# Patient Record
Sex: Female | Born: 1950 | Race: White | Hispanic: No | Marital: Married | Smoking: Former smoker
Health system: Southern US, Community
[De-identification: ages and names within clinical notes are randomized; demographics above are authoritative.]

## PROBLEM LIST (undated history)

## (undated) DIAGNOSIS — E079 Disorder of thyroid, unspecified: Secondary | ICD-10-CM

## (undated) DIAGNOSIS — Z973 Presence of spectacles and contact lenses: Secondary | ICD-10-CM

## (undated) DIAGNOSIS — K219 Gastro-esophageal reflux disease without esophagitis: Secondary | ICD-10-CM

## (undated) DIAGNOSIS — G4711 Idiopathic hypersomnia with long sleep time: Secondary | ICD-10-CM

## (undated) DIAGNOSIS — R112 Nausea with vomiting, unspecified: Secondary | ICD-10-CM

## (undated) DIAGNOSIS — G4730 Sleep apnea, unspecified: Secondary | ICD-10-CM

## (undated) DIAGNOSIS — Z9889 Other specified postprocedural states: Secondary | ICD-10-CM

## (undated) DIAGNOSIS — C50919 Malignant neoplasm of unspecified site of unspecified female breast: Secondary | ICD-10-CM

## (undated) DIAGNOSIS — E039 Hypothyroidism, unspecified: Secondary | ICD-10-CM

## (undated) HISTORY — PX: COSMETIC SURGERY: SHX468

## (undated) HISTORY — DX: Malignant neoplasm of unspecified site of unspecified female breast: C50.919

## (undated) HISTORY — PX: TOTAL THYROIDECTOMY: SHX2547

## (undated) HISTORY — PX: TONSILLECTOMY: SUR1361

## (undated) HISTORY — PX: COLONOSCOPY: SHX174

## (undated) HISTORY — PX: ABDOMINAL HYSTERECTOMY: SHX81

## (undated) HISTORY — PX: FACIAL COSMETIC SURGERY: SHX629

---

## 1968-04-07 HISTORY — PX: CLAVICLE SURGERY: SHX598

## 1968-04-07 HISTORY — PX: ORIF WRIST FRACTURE: SHX2133

## 1968-04-07 HISTORY — PX: PLEURAL SCARIFICATION: SHX748

## 1970-04-07 HISTORY — PX: THYROIDECTOMY, PARTIAL: SHX18

## 1991-04-08 HISTORY — PX: THYROIDECTOMY: SHX17

## 1999-04-08 HISTORY — PX: CARPAL TUNNEL RELEASE: SHX101

## 1999-12-12 ENCOUNTER — Other Ambulatory Visit: Admission: RE | Admit: 1999-12-12 | Discharge: 1999-12-12 | Payer: Self-pay | Admitting: Obstetrics and Gynecology

## 2000-03-05 ENCOUNTER — Other Ambulatory Visit: Admission: RE | Admit: 2000-03-05 | Discharge: 2000-03-05 | Payer: Self-pay | Admitting: Obstetrics and Gynecology

## 2000-10-13 ENCOUNTER — Other Ambulatory Visit: Admission: RE | Admit: 2000-10-13 | Discharge: 2000-10-13 | Payer: Self-pay | Admitting: Obstetrics and Gynecology

## 2001-02-22 ENCOUNTER — Other Ambulatory Visit: Admission: RE | Admit: 2001-02-22 | Discharge: 2001-02-22 | Payer: Self-pay | Admitting: Obstetrics and Gynecology

## 2002-01-18 ENCOUNTER — Ambulatory Visit (HOSPITAL_BASED_OUTPATIENT_CLINIC_OR_DEPARTMENT_OTHER): Admission: RE | Admit: 2002-01-18 | Discharge: 2002-01-18 | Payer: Self-pay | Admitting: Orthopedic Surgery

## 2002-02-28 ENCOUNTER — Emergency Department (HOSPITAL_COMMUNITY): Admission: EM | Admit: 2002-02-28 | Discharge: 2002-03-01 | Payer: Self-pay | Admitting: Emergency Medicine

## 2002-03-08 ENCOUNTER — Other Ambulatory Visit: Admission: RE | Admit: 2002-03-08 | Discharge: 2002-03-08 | Payer: Self-pay | Admitting: *Deleted

## 2002-12-29 ENCOUNTER — Encounter: Payer: Self-pay | Admitting: Orthopedic Surgery

## 2002-12-29 ENCOUNTER — Ambulatory Visit (HOSPITAL_COMMUNITY): Admission: RE | Admit: 2002-12-29 | Discharge: 2002-12-29 | Payer: Self-pay | Admitting: Orthopedic Surgery

## 2003-02-10 ENCOUNTER — Observation Stay (HOSPITAL_COMMUNITY): Admission: RE | Admit: 2003-02-10 | Discharge: 2003-02-11 | Payer: Self-pay | Admitting: Neurosurgery

## 2003-03-20 ENCOUNTER — Other Ambulatory Visit: Admission: RE | Admit: 2003-03-20 | Discharge: 2003-03-20 | Payer: Self-pay | Admitting: Obstetrics and Gynecology

## 2008-09-12 ENCOUNTER — Emergency Department (HOSPITAL_COMMUNITY): Admission: EM | Admit: 2008-09-12 | Discharge: 2008-09-12 | Payer: Self-pay | Admitting: Emergency Medicine

## 2010-08-23 NOTE — Op Note (Signed)
NAME:  Charlotte [name]                           ACCOUNT NO.:  [id number]   MEDICAL RECORD NO.:  [id number]                   PATIENT TYPE:  INP   LOCATION:  3032                                 FACILITY:  [facility]   PHYSICIAN:  Danae Orleans. Venetia Maxon, M.D.               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   PROCEDURE:  Anterior cervical decompression and fusion C4-5 and C5-6 levels  with allograft bone graft and anterior cervical plate.   SURGEON:  Danae Orleans. Venetia Maxon, M.D.   ASSISTANT:  Hewitt Shorts, M.D.   ANESTHESIA:  General endotracheal anesthesia.   ESTIMATED BLOOD LOSS:  Minimal.   COMPLICATIONS:  None, disposition to recovery.   INDICATIONS FOR PROCEDURE:  Charlotte [name] is a 60 year old woman with a  herniated cervical disk  and cervical spondylosis at the C4-5 and C5-6  levels. She has significant bilateral upper extremity pain and weakness. It  was elected to take her to surgery for anterior cervical decompression and  fusion of the affected level.   DESCRIPTION OF PROCEDURE:  Charlotte [name] is brought to the operating room.  Following  the satisfactory and uncomplicated induction of general  endotracheal anesthesia she placed in the supine position on the operating  table. Her neck  was placed in extension and placed in 10 pounds of halter  traction. Her anterior neck  was then prepped and draped in the usual  sterile fashion. The area of planned incision was infiltrated with 0.25%  Marcaine and 0.5% Lidocaine with 1:200,000 epinephrine.   An incision was made in the midline, followed from the  midline  to the  anterior border of the sternocleidomastoid muscle on the left side of the  midline. This was carried sharply through the platysmal layers. Subplatysmal  dissection was performed exposing the anterior border of the  sternocleidomastoid muscle. Using blunt dissection the carotid sheath was  kept lateral  and the trachea and esophagus kept medial and the anterior  cervical spine was identified.   A bent spinal needle was placed in what was found to be C3-4 level. Exposure  was then performed at the C4-5 and C5-6 levels and the longus coli muscles  were taken down from the anterior cervical spine using electrocautery and  key elevator from C4 through C6 levels bilaterally. A self-retaining  Shadowline retractor was placed to facilitate exposure along __________  retractor.   The C4-5 and C5-6 levels were then incised with a #15 blade  and disk  material was removed in a piecemeal fashion. A variety of Carlen curets  were  used to remove the endplates of residual cartilaginous and disk material. A  disk  space spreader was placed at each level  sequentially, and using the  operating microscope the uncinate spurs were drilled down  initially at the  C5-6 level, and subsequently the posterior longitudinal ligament was incised  with an arachnoid knife and removed in a piecemeal fashion resulting in  decompression of the central spinal cord dura and both C6 nerve roots as  they extended up the neuroforamina. Hemostasis was assured with Gelfoam  soaked in thrombin. After using trial sizers, a 7-mm cortico cancellous  allograft bone wedge was inserted into the interspace and countersunk  appropriately.   Attention was then turned to the C4-5 level where a s similar decompression  was performed. There were large uncinate spurs and these were drilled down  and subsequently removed with a 2-mm gold tipped Kerrison rongeur. The  interspace was decompressed as were both C5 nerve roots  as they exited out  the neuroforamina. A similarly sized bone graft was rehydrated, inserted  into the interspace and countersunk appropriately.   Subsequently a 40-mm Trinica anterior cervical plate was affixed to the  anterior cervical spine using 14-mm variable angle screws, four, 2 at C5 and  2 at C6 levels. The locking mechanisms were engaged. Final x-ray confirmed  positioning of bone grafts and anterior cervical plate. The wound was then  copiously irrigated with Bacitracin saline. The soft tissues were inspected  and found to be in good repair.   The platysma layer was then closed with 3-0 Vicryl suture. The subcutaneous  tissues were reapproximated with running 4-0 Vicryl subcuticular stitch. The  wound was dressed with Dermabond.   The patient was extubated in the operating room and taken to the recovery  room in stable satisfactory condition having tolerated  the operation well.  Counts were correct at the end of the case.                                               Danae Orleans. Venetia Maxon, M.D.    JDS/MEDQ  D:  02/10/2003  T:  02/11/2003  Job:  [id number]

## 2010-08-23 NOTE — Op Note (Signed)
NAME:  Charlotte [name], Charlotte [name]                           ACCOUNT NO.:  [id number]   MEDICAL RECORD NO.:  [id number]                   PATIENT TYPE:  AMB   LOCATION:  DSC                                  FACILITY:  [facility]   PHYSICIAN:  Katy Fitch. Naaman Plummer., M.D.          DATE OF BIRTH:  24-Sep-1950   DATE OF PROCEDURE:  01/18/2002  DATE OF DISCHARGE:                                 OPERATIVE REPORT   PREOPERATIVE DIAGNOSIS:  Entrapment neuropathy, median nerve, right carpal  tunnel.   POSTOPERATIVE DIAGNOSIS:  Entrapment neuropathy, median nerve, right carpal  tunnel.   PROCEDURE:  Release of right transverse carpal ligament.   SURGEON:  Katy Fitch. Sypher, M.D.   ASSISTANT:  Jonni Sanger, P.A.   ANESTHESIA:  General by LMA supervised by the anesthesiologist, Judie Petit, M.D.   INDICATIONS:  The patient is a 60 year old woman who presented for  evaluation and management of hand discomfort and numbness.  Clinical  examination suggested carpal tunnel syndrome.  Electrodiagnostic studies  confirmed median neuropathy at the level of the right transverse carpal  ligament.   Due to a failure to respond to nonoperative measures, she is brought to the  operating room at this time for release of her right transverse carpal  ligament.   DESCRIPTION OF PROCEDURE:  The patient was brought to the operating room and  placed in the supine position on the operating table.  Following induction  of general anesthesia by LMA, the right arm was prepped with Betadine soap  and solution and sterilely draped.   Following exsanguination of the limb with Esmarch bandage, the arterial  tourniquet was inflated to 220 mmHg.  The procedure commenced with a short  incision in the line of the ring finger in the palm.  Subcutaneous tissues  were carefully divided to reveal the palmar fascia.  This was split  longitudinally to reveal the common sensory branch of the median nerve.  These were followed  back to the transverse carpal ligament, which was  carefully isolated from the median nerve.   Bleeding points along the margin of the ligament were electrocauterized with  bipolar current.   The wound was then repaired with intradermal 3-0 Prolene.  A compressive  dressing was applied with a volar plaster splint maintaining the wrist in 5  degrees of dorsiflexion.   The patient tolerated the surgery and anesthesia well.  She was transferred  to recovery with stable vital signs.   She will be discharged in the care of her family with prescriptions for  Darvocet-N 100 one or two tablets p.o. q.4-6h. p.r.n. pain, 20 tablets  without refill.  She is also advised to use Tylenol or Advil obtained over-  the-counter p.r.n. mild pain.  Katy Fitch Naaman Plummer., M.D.    RVS/MEDQ  D:  01/18/2002  T:  01/19/2002  Job:  [id number]   cc:   Geoffry Paradise, MD  [address]  [place]  [place] [address]  Fax: [phone]

## 2012-06-18 ENCOUNTER — Other Ambulatory Visit: Payer: Self-pay | Admitting: Dermatology

## 2012-12-27 ENCOUNTER — Emergency Department (HOSPITAL_COMMUNITY)
Admission: EM | Admit: 2012-12-27 | Discharge: 2012-12-28 | Disposition: A | Discharge status: Home / Self Care | Payer: BC Managed Care – PPO | Attending: Emergency Medicine | Admitting: Emergency Medicine

## 2012-12-27 ENCOUNTER — Emergency Department (HOSPITAL_COMMUNITY): Payer: BC Managed Care – PPO

## 2012-12-27 ENCOUNTER — Encounter (HOSPITAL_COMMUNITY): Payer: Self-pay | Admitting: Emergency Medicine

## 2012-12-27 DIAGNOSIS — Z862 Personal history of diseases of the blood and blood-forming organs and certain disorders involving the immune mechanism: Secondary | ICD-10-CM | POA: Insufficient documentation

## 2012-12-27 DIAGNOSIS — H538 Other visual disturbances: Secondary | ICD-10-CM | POA: Insufficient documentation

## 2012-12-27 DIAGNOSIS — R51 Headache: Secondary | ICD-10-CM | POA: Insufficient documentation

## 2012-12-27 DIAGNOSIS — H532 Diplopia: Secondary | ICD-10-CM | POA: Insufficient documentation

## 2012-12-27 DIAGNOSIS — Z8639 Personal history of other endocrine, nutritional and metabolic disease: Secondary | ICD-10-CM | POA: Insufficient documentation

## 2012-12-27 HISTORY — DX: Disorder of thyroid, unspecified: E07.9

## 2012-12-27 LAB — COMPREHENSIVE METABOLIC PANEL
ALT: 25 U/L (ref 0–35)
AST: 32 U/L (ref 0–37)
Albumin: 4.3 g/dL (ref 3.5–5.2)
Alkaline Phosphatase: 71 U/L (ref 39–117)
Calcium: 10 mg/dL (ref 8.4–10.5)
Chloride: 105 mEq/L (ref 96–112)
GFR calc Af Amer: >90 mL/min (ref >90)
Glucose, Bld: 89 mg/dL (ref 70–99)
Potassium: 3.8 mEq/L (ref 3.5–5.1)
Sodium: 140 mEq/L (ref 135–145)
Total Bilirubin: 0.5 mg/dL (ref 0.3–1.2)
Total Protein: 7.2 g/dL (ref 6.0–8.3)

## 2012-12-27 LAB — CBC
Hemoglobin: 14.7 g/dL (ref 12.0–15.0)
MCH: 29.9 pg (ref 26.0–34.0)
Platelets: 220 10*3/uL (ref 150–400)
RBC: 4.91 MIL/uL (ref 3.87–5.11)
RDW: 13 % (ref 11.5–15.5)
WBC: 5.7 10*3/uL (ref 4.0–10.5)

## 2012-12-27 LAB — DIFFERENTIAL
Basophils Absolute: 0.1 K/uL (ref 0.0–0.1)
Basophils Relative: 1 % (ref 0–1)
Eosinophils Absolute: 0.1 K/uL (ref 0.0–0.7)
Eosinophils Relative: 2 % (ref 0–5)
Lymphocytes Relative: 35 % (ref 12–46)
Lymphs Abs: 2 K/uL (ref 0.7–4.0)
Monocytes Absolute: 0.6 K/uL (ref 0.1–1.0)
Monocytes Relative: 10 % (ref 3–12)
Neutro Abs: 2.9 K/uL (ref 1.7–7.7)
Neutrophils Relative %: 51 % (ref 43–77)

## 2012-12-27 LAB — SEDIMENTATION RATE: Sed Rate: 5 mm/h (ref 0–22)

## 2012-12-27 LAB — APTT: aPTT: 28 s (ref 24–37)

## 2012-12-27 LAB — PROTIME-INR
INR: 0.98 (ref 0.00–1.49)
Prothrombin Time: 12.8 seconds (ref 11.6–15.2)

## 2012-12-27 LAB — POCT I-STAT TROPONIN I: Troponin i, poc: 0 ng/mL (ref 0.00–0.08)

## 2012-12-27 NOTE — ED Provider Notes (Signed)
CSN: [id number]     Arrival date & time 12/27/12  1446 History   First MD Initiated Contact with Patient 12/27/12 1609     Chief Complaint  Patient presents with  . Diplopia   (Consider location/radiation/quality/duration/timing/severity/associated sxs/prior Treatment) HPI Comments: 62 year old female presents from her eye doctor's office with acute blurry vision. States it started about 7:30 AM. She awoke normally and then later had acute onset of trouble seeing. She noticed an hour later that it was more double vision. She does wish to her upper left eye all her symptoms seemed to resolve. She has not had symptoms for almost 9 hours. Patient feels a global sensation in her head but is not sure if his pain or just being uncomfortable. She feels like maybe a little bit of pressure behind her eyes bilaterally. She went to an eye doctor (Dr. Velna Ochs) who states that her eyes were dilated by the time he examined her but he noticed some left eso and hypotropia that were not present on exam a few weeks ago. She was sent here to rule out stroke. Patient does state that her symptoms seem to be better when looking straight ahead and exacerbated by turning side to side.  The history is provided by the patient.    Past Medical History  Diagnosis Date  . Thyroid disease    History reviewed. No pertinent past surgical history. History reviewed. No pertinent family history. History  Substance Use Topics  . Smoking status: Never Smoker   . Smokeless tobacco: Not on file  . Alcohol Use: Yes   OB History   Grav Para Term Preterm Abortions TAB SAB Ect Mult Living                 Review of Systems  Constitutional: Negative for fever.  Eyes: Positive for photophobia and visual disturbance. Negative for pain and redness.  Gastrointestinal: Negative for vomiting.  Neurological: Negative for speech difficulty, weakness, numbness and headaches.  All other systems reviewed and are  negative.    Allergies  Review of patient's allergies indicates no known allergies.  Home Medications  No current outpatient prescriptions on file. BP 166/67  Pulse 65  Temp(Src) 98 F (36.7 C) (Oral)  Resp 18  SpO2 99% Physical Exam  Nursing note and vitals reviewed. Constitutional: She is oriented to person, place, and time. She appears well-developed and well-nourished.  HENT:  Head: Normocephalic and atraumatic.  Right Ear: External ear normal.  Left Ear: External ear normal.  Nose: Nose normal.  Eyes: EOM are normal. Right eye exhibits no discharge. Left eye exhibits no discharge.  Eyes are dilated  Neck: Normal range of motion. Neck supple.  Cardiovascular: Normal rate, regular rhythm and normal heart sounds.   Pulmonary/Chest: Effort normal and breath sounds normal.  Abdominal: Soft. There is no tenderness.  Neurological: She is alert and oriented to person, place, and time. She has normal strength. No cranial nerve deficit or sensory deficit. GCS eye subscore is 4. GCS verbal subscore is 5. GCS motor subscore is 6.  Skin: Skin is warm and dry.    ED Course  Procedures (including critical care time) Labs Review Labs Reviewed  COMPREHENSIVE METABOLIC PANEL - Abnormal; Notable for the following:    GFR calc non Af Amer 89 (*)    All other components within normal limits  PROTIME-INR  APTT  CBC  DIFFERENTIAL  TROPONIN I  SEDIMENTATION RATE  C-REACTIVE PROTEIN  POCT I-STAT TROPONIN I  Imaging Review Ct Head (brain) Wo Contrast  12/27/2012   CLINICAL DATA:  62 year old female with blurred vision at 0700 hr progressing to double vision. Headache.  EXAM: CT HEAD WITHOUT CONTRAST  TECHNIQUE: Contiguous axial images were obtained from the base of the skull through the vertex without intravenous contrast.  COMPARISON:  None.  FINDINGS: Cervical ACDF evident on the scout view. Visualized paranasal sinuses and mastoids are clear. No acute osseous abnormality identified.  Visualized orbits and scalp soft tissues are within normal limits.  Normal cerebral volume. No midline shift, ventriculomegaly, mass effect, evidence of mass lesion, intracranial hemorrhage or evidence of cortically based acute infarction. Gray-white matter differentiation is within normal limits throughout the brain. No suspicious intracranial vascular hyperdensity.  IMPRESSION: Normal noncontrast CT appearance of the brain.   Electronically Signed   By: Augusto Gamble M.D.   On: 12/27/2012 15:46   Mr Maxine Glenn Head Wo Contrast  12/27/2012   CLINICAL DATA:  Left diploplia.  Evaluate for aneurysm  EXAM: MRA HEAD WITHOUT CONTRAST  TECHNIQUE: MRA HEAD WITHOUT CONTRAST  COMPARISON:  None.  FINDINGS: Standard intracranial arterial anatomy. No aneurysm. No major vessel occlusion or significant stenosis.  IMPRESSION: Negative for cerebral aneurysm.   Electronically Signed   By: Tiburcio Pea   On: 12/27/2012 23:46   Mr Brain Wo Contrast  12/27/2012   *RADIOLOGY REPORT*  Clinical Data: Diplopia.  Blurred vision.  MRI HEAD WITHOUT CONTRAST  Technique:  Multiplanar, multiecho pulse sequences of the brain and surrounding structures were obtained according to standard protocol without intravenous contrast.  Comparison: CT head 12/27/2012.  Findings: There is no evidence for acute infarction, intracranial hemorrhage, mass lesion, hydrocephalus, or extra-axial fluid. Normal cerebral volume.  Minimal periventricular signal abnormality, nonspecific, could represent early chronic microvascular ischemic change.  No midline shift.  Flow voids are maintained.  No osseous lesions.  Visualized orbits appear  symmetric and normal.  No sinus or mastoid disease.  7 mm T2 bright lesion deep lobe of the left parotid, likely incidental cyst or benign neoplasm.  Recommend 52-month MRI neck without and with contrast for follow up.  Other extracranial soft tissues unremarkable. No visible thyroid related extraocular muscle enlargement.  Compared  with prior CT, there is good general agreement.  IMPRESSION: No acute intracranial findings. No visible orbital abnormality. Mild nonspecific periventricular white matter signal abnormality.  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  6 months follow-up recommended.   Original Report Authenticated By: Davonna Belling, M.D.    MDM   1. Diplopia    MRI, MRA, CT head and labs are all negative. Neuro evaluated in ED, feel she needs further ophtho w/u. Discussed with Lelan Pons [facility] ophthamology covering for neuro ophtho) who agrees with workup and states there is nothing else to acutely be done. Recommends f/u with her eye doctor tomorrow for further testing and precautions against driving, etc. Also d/w Dr. Delaney Meigs (ophthamology) who states patient can f/u with Dr. Lucretia Roers or himself, but also agrees the workup in the ED is sufficient and that she needs strict return precautions but otherwise can follow up in the AM. I discussed this with patient and husband, and they understand and had their questions answered. As there is no headache with normal ESR I feel temporal arteritis is unlikely, as well as any type of SAH, mass or other cause of diplopia. She seems to have normal EOM and no lid lag to suggest a frank nerve palsy.    Shaheem Pichon  Scarlette Calico, MD 12/28/12 [phone]

## 2012-12-27 NOTE — ED Notes (Signed)
Pt sts woke up at 0700 and then started having blurry vision that turned to double vision at 0830; pt sts mild HA; pt saw eye doctor today and sent here; pt denies other complaint; spoke with EDP RL about pt

## 2012-12-27 NOTE — ED Notes (Signed)
Patient transported to MRI 

## 2012-12-27 NOTE — ED Notes (Signed)
Received report from off going RN.  Pt is MRI

## 2012-12-27 NOTE — Consult Note (Signed)
NEURO HOSPITALIST CONSULT NOTE    Reason for Consult: Diplopia  HPI:                                                                                                                                          Charlotte [name] is an 62 y.o. female who woke up this AM feeling normal. One hour after waking she noted she had blurred vision. 2 hours after waking she was driving her car and noted horizontal diplopia.  She called her primary care MD and could not get a appointment but did have a appointment with her ophthalmologist. The ophthalmological exam was negative for intrinsic eye etiology and she was told to go to ED.  Currently she is having far gaze diplopia with no other neurological symptoms.   Past Medical History  Diagnosis Date  . Thyroid disease     History reviewed. No pertinent past surgical history.  Family History  Problem Relation Age of Onset  . Hypertension Mother   . Hypertension Father     Social History:  reports that she has never smoked. She does not have any smokeless tobacco history on file. She reports that  drinks alcohol. She reports that she does not use illicit drugs.  No Known Allergies  MEDICATIONS:                                                                                                                     No current facility-administered medications for this encounter.   Current Outpatient Prescriptions  Medication Sig Dispense Refill  . estradiol (VIVELLE-DOT) 0.0375 MG/24HR Place 1 patch onto the skin every 3 (three) days.      Marland Kitchen levothyroxine (SYNTHROID, LEVOTHROID) 88 MCG tablet Take 88 mcg by mouth daily before breakfast.          ROS:  History obtained from the patient  General ROS: negative for - chills, fatigue, fever, night sweats, weight gain or weight loss Psychological ROS:  negative for - behavioral disorder, hallucinations, memory difficulties, mood swings or suicidal ideation Ophthalmic ROS: positive for - blurry vision, double vision,  ENT ROS: negative for - epistaxis, nasal discharge, oral lesions, sore throat, tinnitus or vertigo Allergy and Immunology ROS: negative for - hives or itchy/watery eyes Hematological and Lymphatic ROS: negative for - bleeding problems, bruising or swollen lymph nodes Endocrine ROS: negative for - galactorrhea, hair pattern changes, polydipsia/polyuria or temperature intolerance Respiratory ROS: negative for - cough, hemoptysis, shortness of breath or wheezing Cardiovascular ROS: negative for - chest pain, dyspnea on exertion, edema or irregular heartbeat Gastrointestinal ROS: negative for - abdominal pain, diarrhea, hematemesis, nausea/vomiting or stool incontinence Genito-Urinary ROS: negative for - dysuria, hematuria, incontinence or urinary frequency/urgency Musculoskeletal ROS: negative for - joint swelling or muscular weakness Neurological ROS: as noted in HPI Dermatological ROS: negative for rash and skin lesion changes   Blood pressure 159/62, pulse 66, temperature 98 F (36.7 C), temperature source Oral, resp. rate 16, SpO2 99.00%.   Neurologic Examination:                                                                                                      Mental Status: Alert, oriented, thought content appropriate.  Speech fluent without evidence of aphasia.  Able to follow 3 step commands without difficulty. Cranial Nerves: II: Discs flat bilaterally; Visual fields grossly normal, pupils 6mm (dilated at ophthalmologist) equal, round, reactive to light and accommodation ---with far vision patient sees diplopia (horizontal) but with close vision she sees normally.  III,IV, VI: ptosis not present, extra-ocular motions intact bilaterally V,VII: smile symmetric, facial light touch sensation normal bilaterally VIII:  hearing normal bilaterally IX,X: gag reflex present XI: bilateral shoulder shrug XII: midline tongue extension Motor: Right : Upper extremity   5/5    Left:     Upper extremity   5/5  Lower extremity   5/5     Lower extremity   5/5 Tone and bulk:normal tone throughout; no atrophy noted Sensory: Pinprick and light touch intact throughout, bilaterally Deep Tendon Reflexes:  Right: Upper Extremity   Left: Upper extremity   biceps (C-5 to C-6) 2/4   biceps (C-5 to C-6) 2/4 tricep (C7) 2/4    triceps (C7) 2/4 Brachioradialis (C6) 2/4  Brachioradialis (C6) 2/4  Lower Extremity Lower Extremity  quadriceps (L-2 to L-4) 2/4   quadriceps (L-2 to L-4) 2/4 Achilles (S1) 2/4   Achilles (S1) 2/4  Plantars: Right: downgoing   Left: downgoing Cerebellar: normal finger-to-nose,  normal heel-to-shin test CV: pulses palpable throughout    No components found with this basename: cbc,  bmp,  coags,  chol,  tri,  ldl,  hga1c    Results for orders placed during the hospital encounter of 12/27/12 (from the past 48 hour(s))  CBC     Status: None   Collection Time    12/27/12  2:59 PM      Result Value  Range   WBC 5.7  4.0 - 10.5 K/uL   RBC 4.91  3.87 - 5.11 MIL/uL   Hemoglobin 14.7  12.0 - 15.0 g/dL   HCT 16.1  09.6 - 04.5 %   MCV 86.2  78.0 - 100.0 fL   MCH 29.9  26.0 - 34.0 pg   MCHC 34.8  30.0 - 36.0 g/dL   RDW 40.9  81.1 - 91.4 %   Platelets 220  150 - 400 K/uL  DIFFERENTIAL     Status: None   Collection Time    12/27/12  2:59 PM      Result Value Range   Neutrophils Relative % 51  43 - 77 %   Neutro Abs 2.9  1.7 - 7.7 K/uL   Lymphocytes Relative 35  12 - 46 %   Lymphs Abs 2.0  0.7 - 4.0 K/uL   Monocytes Relative 10  3 - 12 %   Monocytes Absolute 0.6  0.1 - 1.0 K/uL   Eosinophils Relative 2  0 - 5 %   Eosinophils Absolute 0.1  0.0 - 0.7 K/uL   Basophils Relative 1  0 - 1 %   Basophils Absolute 0.1  0.0 - 0.1 K/uL  POCT I-STAT TROPONIN I     Status: None   Collection Time     12/27/12  5:15 PM      Result Value Range   Troponin i, poc 0.00  0.00 - 0.08 ng/mL   Comment 3            Comment: Due to the release kinetics of cTnI,     a negative result within the first hours     of the onset of symptoms does not rule out     myocardial infarction with certainty.     If myocardial infarction is still suspected,     repeat the test at appropriate intervals.    Ct Head (brain) Wo Contrast  12/27/2012   CLINICAL DATA:  62 year old female with blurred vision at 0700 hr progressing to double vision. Headache.  EXAM: CT HEAD WITHOUT CONTRAST  TECHNIQUE: Contiguous axial images were obtained from the base of the skull through the vertex without intravenous contrast.  COMPARISON:  None.  FINDINGS: Cervical ACDF evident on the scout view. Visualized paranasal sinuses and mastoids are clear. No acute osseous abnormality identified. Visualized orbits and scalp soft tissues are within normal limits.  Normal cerebral volume. No midline shift, ventriculomegaly, mass effect, evidence of mass lesion, intracranial hemorrhage or evidence of cortically based acute infarction. Gray-white matter differentiation is within normal limits throughout the brain. No suspicious intracranial vascular hyperdensity.  IMPRESSION: Normal noncontrast CT appearance of the brain.   Electronically Signed   By: Augusto Gamble M.D.   On: 12/27/2012 15:46     Assessment/Plan: 62 YO female presenting with  sudden onset far gaze diplopia (divergence insufficiency). Neuro exam is non-focal otherwise.  Given sudden onset cannot exclude possibility of small midbrain infarct.    Recommend: 1) MRI brain--if negative no further stroke work-up warranted, otherwise admission for complete stroke risk assessment.  2) ESR, CRP to evaluated for vasculitis. 3) Aspirin 325 mg per day 4) Ophthalmology followup if MRI is unremarkable  Felicie Morn PA-C [facility] [phone]  12/27/2012, 5:36 PM  I personally  participated in this patient's evaluation and management including neurological examination, as well as formulating the above clinical impression and management recommendations.  Venetia Maxon M.D. [facility] [phone]

## 2012-12-28 LAB — C-REACTIVE PROTEIN: CRP: <0.5 mg/dL — ABNORMAL LOW (ref <0.60)

## 2012-12-28 LAB — GLUCOSE, CAPILLARY: Glucose-Capillary: 79 mg/dL (ref 70–99)

## 2012-12-28 MED ORDER — LIDOCAINE HCL 1 % IJ SOLN
INTRAMUSCULAR | Status: AC
  Filled 2012-12-28: qty 10

## 2013-05-03 ENCOUNTER — Other Ambulatory Visit: Payer: Self-pay | Admitting: Orthopedic Surgery

## 2013-05-05 ENCOUNTER — Encounter (HOSPITAL_BASED_OUTPATIENT_CLINIC_OR_DEPARTMENT_OTHER): Payer: Self-pay | Admitting: *Deleted

## 2013-05-05 NOTE — Progress Notes (Signed)
No labs needed-did have labs and ekg ed 9/14 for blurred vision
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
## 2013-05-10 ENCOUNTER — Encounter (HOSPITAL_BASED_OUTPATIENT_CLINIC_OR_DEPARTMENT_OTHER)
Admission: RE | Disposition: A | Discharge status: Home / Self Care | Payer: Self-pay | Source: Ambulatory Visit | Attending: Orthopedic Surgery

## 2013-05-10 ENCOUNTER — Encounter (HOSPITAL_BASED_OUTPATIENT_CLINIC_OR_DEPARTMENT_OTHER): Payer: Medicare Other | Admitting: Anesthesiology

## 2013-05-10 ENCOUNTER — Ambulatory Visit (HOSPITAL_BASED_OUTPATIENT_CLINIC_OR_DEPARTMENT_OTHER): Payer: Medicare Other | Admitting: Anesthesiology

## 2013-05-10 ENCOUNTER — Encounter (HOSPITAL_BASED_OUTPATIENT_CLINIC_OR_DEPARTMENT_OTHER): Payer: Self-pay

## 2013-05-10 ENCOUNTER — Ambulatory Visit (HOSPITAL_BASED_OUTPATIENT_CLINIC_OR_DEPARTMENT_OTHER)
Admission: RE | Admit: 2013-05-10 | Discharge: 2013-05-10 | Disposition: A | Discharge status: Home / Self Care | Payer: Medicare Other | Source: Ambulatory Visit | Attending: Orthopedic Surgery | Admitting: Orthopedic Surgery

## 2013-05-10 DIAGNOSIS — G5600 Carpal tunnel syndrome, unspecified upper limb: Secondary | ICD-10-CM | POA: Diagnosis not present

## 2013-05-10 HISTORY — DX: Hypothyroidism, unspecified: E03.9

## 2013-05-10 HISTORY — DX: Presence of spectacles and contact lenses: Z97.3

## 2013-05-10 HISTORY — PX: CARPAL TUNNEL RELEASE: SHX101

## 2013-05-10 HISTORY — DX: Nausea with vomiting, unspecified: R11.2

## 2013-05-10 HISTORY — DX: Nausea with vomiting, unspecified: Z98.890

## 2013-05-10 LAB — POCT HEMOGLOBIN-HEMACUE: Hemoglobin: 14.6 g/dL (ref 12.0–15.0)

## 2013-05-10 SURGERY — CARPAL TUNNEL RELEASE
Anesthesia: General | Site: Wrist | Laterality: Left

## 2013-05-10 MED ORDER — LIDOCAINE HCL 2 % IJ SOLN
INTRAMUSCULAR | Status: AC
  Filled 2013-05-10: qty 20

## 2013-05-10 MED ORDER — MIDAZOLAM HCL 5 MG/5ML IJ SOLN
INTRAMUSCULAR | Status: DC | PRN
  Administered 2013-05-10: 1 mg via INTRAVENOUS

## 2013-05-10 MED ORDER — FENTANYL CITRATE 0.05 MG/ML IJ SOLN: 25.0000 ug | INTRAMUSCULAR | Status: DC | PRN

## 2013-05-10 MED ORDER — OXYCODONE HCL 5 MG/5ML PO SOLN: 5.0000 mg | Freq: Once | ORAL | Status: DC | PRN

## 2013-05-10 MED ORDER — LACTATED RINGERS IV SOLN
INTRAVENOUS | Status: DC
  Administered 2013-05-10 (×2): via INTRAVENOUS

## 2013-05-10 MED ORDER — LIDOCAINE HCL (CARDIAC) 20 MG/ML IV SOLN
INTRAVENOUS | Status: DC | PRN
  Administered 2013-05-10: 50 mg via INTRAVENOUS

## 2013-05-10 MED ORDER — ONDANSETRON HCL 4 MG/2ML IJ SOLN
INTRAMUSCULAR | Status: DC | PRN
  Administered 2013-05-10: 4 mg via INTRAVENOUS

## 2013-05-10 MED ORDER — FENTANYL CITRATE 0.05 MG/ML IJ SOLN: 50.0000 ug | INTRAMUSCULAR | Status: DC | PRN

## 2013-05-10 MED ORDER — OXYCODONE HCL 5 MG PO TABS: 5.0000 mg | ORAL_TABLET | Freq: Once | ORAL | Status: DC | PRN

## 2013-05-10 MED ORDER — PROMETHAZINE HCL 25 MG/ML IJ SOLN: 6.2500 mg | INTRAMUSCULAR | Status: DC | PRN

## 2013-05-10 MED ORDER — FENTANYL CITRATE 0.05 MG/ML IJ SOLN
INTRAMUSCULAR | Status: DC | PRN
  Administered 2013-05-10: 50 ug via INTRAVENOUS

## 2013-05-10 MED ORDER — GLYCOPYRROLATE 0.2 MG/ML IJ SOLN
INTRAMUSCULAR | Status: DC | PRN
  Administered 2013-05-10: 0.2 mg via INTRAVENOUS

## 2013-05-10 MED ORDER — MIDAZOLAM HCL 2 MG/2ML IJ SOLN
INTRAMUSCULAR | Status: AC
  Filled 2013-05-10: qty 2

## 2013-05-10 MED ORDER — CHLORHEXIDINE GLUCONATE 4 % EX LIQD: 60.0000 mL | Freq: Once | CUTANEOUS | Status: DC

## 2013-05-10 MED ORDER — PROPOFOL 10 MG/ML IV BOLUS
INTRAVENOUS | Status: DC | PRN
  Administered 2013-05-10: 130 mg via INTRAVENOUS

## 2013-05-10 MED ORDER — ACETAMINOPHEN-CODEINE #3 300-30 MG PO TABS: 1.0000 | ORAL_TABLET | ORAL | Status: DC | PRN

## 2013-05-10 MED ORDER — PROPOFOL 10 MG/ML IV BOLUS
INTRAVENOUS | Status: AC
  Filled 2013-05-10: qty 20

## 2013-05-10 MED ORDER — DEXAMETHASONE SODIUM PHOSPHATE 4 MG/ML IJ SOLN
INTRAMUSCULAR | Status: DC | PRN
  Administered 2013-05-10: 8 mg via INTRAVENOUS

## 2013-05-10 MED ORDER — MIDAZOLAM HCL 2 MG/2ML IJ SOLN: 1.0000 mg | INTRAMUSCULAR | Status: DC | PRN

## 2013-05-10 MED ORDER — LIDOCAINE HCL 2 % IJ SOLN
INTRAMUSCULAR | Status: DC | PRN
  Administered 2013-05-10: 3 mL

## 2013-05-10 MED ORDER — FENTANYL CITRATE 0.05 MG/ML IJ SOLN
INTRAMUSCULAR | Status: AC
  Filled 2013-05-10: qty 2

## 2013-05-10 SURGICAL SUPPLY — 44 items
BANDAGE ADH SHEER 1  50/CT (GAUZE/BANDAGES/DRESSINGS) IMPLANT
BANDAGE ELASTIC 3 VELCRO ST LF (GAUZE/BANDAGES/DRESSINGS) IMPLANT
BLADE SURG 15 STRL LF DISP TIS (BLADE) ×1 IMPLANT
BLADE SURG 15 STRL SS (BLADE) ×2
BNDG COHESIVE 3X5 TAN STRL LF (GAUZE/BANDAGES/DRESSINGS) ×3 IMPLANT
BNDG ESMARK 4X9 LF (GAUZE/BANDAGES/DRESSINGS) ×3 IMPLANT
BRUSH SCRUB EZ PLAIN DRY (MISCELLANEOUS) ×3 IMPLANT
CLOSURE WOUND 1/2 X4 (GAUZE/BANDAGES/DRESSINGS) ×1
CORDS BIPOLAR (ELECTRODE) IMPLANT
COVER MAYO STAND STRL (DRAPES) ×3 IMPLANT
COVER TABLE BACK 60X90 (DRAPES) ×3 IMPLANT
CUFF TOURNIQUET SINGLE 18IN (TOURNIQUET CUFF) ×3 IMPLANT
DECANTER SPIKE VIAL GLASS SM (MISCELLANEOUS) IMPLANT
DRAPE EXTREMITY T 121X128X90 (DRAPE) ×3 IMPLANT
DRAPE SURG 17X23 STRL (DRAPES) ×3 IMPLANT
GLOVE BIOGEL M STRL SZ7.5 (GLOVE) IMPLANT
GLOVE BIOGEL PI IND STRL 7.0 (GLOVE) ×3 IMPLANT
GLOVE BIOGEL PI INDICATOR 7.0 (GLOVE) ×6
GLOVE ECLIPSE 6.5 STRL STRAW (GLOVE) ×3 IMPLANT
GLOVE ORTHO TXT STRL SZ7.5 (GLOVE) ×3 IMPLANT
GLOVE SURG SS PI 7.5 STRL IVOR (GLOVE) ×3 IMPLANT
GOWN STRL REUS W/ TWL LRG LVL3 (GOWN DISPOSABLE) ×1 IMPLANT
GOWN STRL REUS W/ TWL XL LVL3 (GOWN DISPOSABLE) ×1 IMPLANT
GOWN STRL REUS W/TWL LRG LVL3 (GOWN DISPOSABLE) ×2
GOWN STRL REUS W/TWL XL LVL3 (GOWN DISPOSABLE) ×2
NDL SAFETY ECLIPSE 18X1.5 (NEEDLE) ×1 IMPLANT
NEEDLE 27GAX1X1/2 (NEEDLE) IMPLANT
NEEDLE HYPO 18GX1.5 SHARP (NEEDLE) ×2
PACK BASIN DAY SURGERY FS (CUSTOM PROCEDURE TRAY) ×3 IMPLANT
PAD CAST 3X4 CTTN HI CHSV (CAST SUPPLIES) ×1 IMPLANT
PADDING CAST ABS 4INX4YD NS (CAST SUPPLIES) ×2
PADDING CAST ABS COTTON 4X4 ST (CAST SUPPLIES) ×1 IMPLANT
PADDING CAST COTTON 3X4 STRL (CAST SUPPLIES) ×2
SPLINT PLASTER CAST XFAST 3X15 (CAST SUPPLIES) ×5 IMPLANT
SPLINT PLASTER XTRA FASTSET 3X (CAST SUPPLIES) ×10
SPONGE GAUZE 4X4 12PLY (GAUZE/BANDAGES/DRESSINGS) IMPLANT
STOCKINETTE 4X48 STRL (DRAPES) ×3 IMPLANT
STRIP CLOSURE SKIN 1/2X4 (GAUZE/BANDAGES/DRESSINGS) ×2 IMPLANT
SUT PROLENE 3 0 PS 2 (SUTURE) ×3 IMPLANT
SYR 3ML 23GX1 SAFETY (SYRINGE) IMPLANT
SYR CONTROL 10ML LL (SYRINGE) ×3 IMPLANT
TOWEL OR 17X24 6PK STRL BLUE (TOWEL DISPOSABLE) ×3 IMPLANT
TRAY DSU PREP LF (CUSTOM PROCEDURE TRAY) ×3 IMPLANT
UNDERPAD 30X30 INCONTINENT (UNDERPADS AND DIAPERS) ×3 IMPLANT

## 2013-05-10 NOTE — Op Note (Signed)
[id number] 

## 2013-05-10 NOTE — Anesthesia Preprocedure Evaluation (Signed)
Anesthesia Evaluation  Patient identified by MRN, date of birth, ID band Patient awake    Airway Mallampati: I      Dental  (+) Teeth Intact and Caps   Pulmonary  breath sounds clear to auscultation        Cardiovascular Rhythm:Regular Rate:Normal     Neuro/Psych    GI/Hepatic negative GI ROS, Neg liver ROS,   Endo/Other    Renal/GU      Musculoskeletal   Abdominal   Peds  Hematology   Anesthesia Other Findings   Reproductive/Obstetrics                           Anesthesia Physical Anesthesia Plan  ASA: I  Anesthesia Plan: General   Post-op Pain Management:    Induction: Intravenous  Airway Management Planned: LMA  Additional Equipment:   Intra-op Plan:   Post-operative Plan: Extubation in OR  Informed Consent: I have reviewed the patients History and Physical, chart, labs and discussed the procedure including the risks, benefits and alternatives for the proposed anesthesia with the patient or authorized representative who has indicated his/her understanding and acceptance.   Dental advisory given  Plan Discussed with: CRNA and Surgeon  Anesthesia Plan Comments:         Anesthesia Quick Evaluation

## 2013-05-10 NOTE — Anesthesia Postprocedure Evaluation (Signed)
  Anesthesia Post-op Note  Patient: Charlotte [name]  Procedure(s) Performed: Procedure(s): LEFT CARPAL TUNNEL RELEASE (Left)  Patient Location: PACU  Anesthesia Type:General  Level of Consciousness: awake  Airway and Oxygen Therapy: Patient Spontanous Breathing  Post-op Pain: mild  Post-op Assessment: Post-op Vital signs reviewed  Post-op Vital Signs: stable  Complications: No apparent anesthesia complications

## 2013-05-10 NOTE — Transfer of Care (Signed)
Immediate Anesthesia Transfer of Care Note  Patient: Charlotte [name]  Procedure(s) Performed: Procedure(s): LEFT CARPAL TUNNEL RELEASE (Left)  Patient Location: PACU  Anesthesia Type:General  Level of Consciousness: sedated and patient cooperative  Airway & Oxygen Therapy: Patient Spontanous Breathing and Patient connected to face mask oxygen  Post-op Assessment: Report given to PACU RN and Post -op Vital signs reviewed and stable  Post vital signs: Reviewed and stable  Complications: No apparent anesthesia complications

## 2013-05-10 NOTE — Discharge Instructions (Addendum)
[facility] Instructions Hand Surgery  Wound Care: Keep your hand elevated above the level of your heart.  Do not allow it to dangle by your side.  Keep the dressing dry and do not remove it unless your doctor advises you to do so.  He will usually change it at the time of your post-op visit.  Moving your fingers is advised to stimulate circulation but will depend on the site of your surgery.  If you have a splint applied, your doctor will advise you regarding movement.  Activity: Do not drive or operate machinery today.  Rest today and then you may return to your normal activity and work as indicated by your physician.  Diet:  Drink liquids today or eat a light diet.  You may resume a regular diet tomorrow.    General expectations: Pain for two to three days. Fingers may become slightly swollen.  Call your doctor if any of the following occur: Severe pain not relieved by pain medication. Elevated temperature. Dressing soaked with blood. Inability to move fingers. White or bluish color to fingers.  Cover left hand bandage with a plastic bag when showering. Keep the left shoulder moving. May use the left hand for light activities but do not get the bandage wet or soiled.

## 2013-05-10 NOTE — Brief Op Note (Signed)
05/10/2013  8:08 AM  PATIENT:  Charlotte [name]  63 y.o. female  PRE-OPERATIVE DIAGNOSIS:  LEFT CARPAL TUNNELL SYNDROME  POST-OPERATIVE DIAGNOSIS:  LEFT CARPAL TUNNEL SYNDROME  PROCEDURE:  Procedure(s): LEFT CARPAL TUNNEL RELEASE (Left)  SURGEON:  Surgeon(s) and Role:    * [name]., MD - Primary  PHYSICIAN ASSISTANT:   ASSISTANTS: Surgical technician   ANESTHESIA:   general  EBL:     BLOOD ADMINISTERED:none  DRAINS: none   LOCAL MEDICATIONS USED:  XYLOCAINE   SPECIMEN:  No Specimen  DISPOSITION OF SPECIMEN:  N/A  COUNTS:  YES  TOURNIQUET:   Total Tourniquet Time Documented: Upper Arm (Left) - 7 minutes Total: Upper Arm (Left) - 7 minutes   DICTATION: .Other Dictation: Dictation Number [id number]  PLAN OF CARE: Discharge to home after PACU  PATIENT DISPOSITION:  PACU - hemodynamically stable.   Delay start of Pharmacological VTE agent (>24hrs) due to surgical blood loss or risk of bleeding: not applicable

## 2013-05-11 NOTE — Op Note (Signed)
NAMEELINOR, [name]                 ACCOUNT NO.:  [id number]  MEDICAL RECORD NO.:  [id number]  LOCATION:                                 FACILITY:  PHYSICIAN:  Charlotte Mighty. Caira Poche, M.D. DATE OF BIRTH:  1950-12-28  DATE OF PROCEDURE:  05/10/2013 DATE OF DISCHARGE:                              OPERATIVE REPORT   PREOPERATIVE DIAGNOSIS:  Left carpal tunnel syndrome.  POSTOPERATIVE DIAGNOSIS:  Left carpal tunnel syndrome.  OPERATIONS:  Release of left transverse carpal ligament.  OPERATING SURGEON:  Charlotte Mighty. Jair Lindblad, MD  ASSISTANT:  Surgical technician.  ANESTHESIA:  General by LMA.  SUPERVISING ANESTHESIOLOGIST:  Ala Dach, MD.  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  She had electrodiagnostic studies in 2012, documenting carpal tunnel syndrome.  She has had transient relief with steroid injections and splinting, but now returns, requesting release of her left transverse carpal ligament.  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  She also is known to have background degenerative disk disease of cervical spine.  After informed consent, she was brought to the operating room at this time.  Preoperatively, she was interviewed by Dr. Orene Desanctis.  General anesthesia by LMA technique was recommended and accepted.  Questions were invited and answered in detail.  The left hand was marked as the proper surgical site with a marking pen in the holding area per protocol.  DESCRIPTION OF PROCEDURE:  Charlotte [name] was transferred to room 2 of the [facility] and placed in the supine position upon the operating table.  Following the induction of general anesthesia by LMA technique under Dr. Griffin Dakin direct supervision, the left hand and arm were prepped with Betadine soap and solution, sterilely draped.  A pneumatic tourniquet was applied to the proximal  left brachium.  Following exsanguination of left arm with Esmarch bandage, arterial tourniquet was inflated to 220 mmHg.  Following routine surgical time- out, procedure commenced with a short incision in line of the ring finger in the palm.  Subcutaneous tissues were carefully divided, revealing the palmar fascia.  This was split longitudinally revealing the common sensory branches of the median nerve and the distal margin of the transverse carpal ligament, taking care to protect the superficial palmar arch.  The carpal canal was sounded with a Penfield 4 elevator, creating a pathway superficial to the median nerve.  The ulnar aspect of the transverse carpal ligament was released with scissors along its ulnar border, extending into the distal forearm.  The volar forearm fascia was likewise released with scissors 4 cm above the distal wrist flexion crease.  Bleeding points were not problematic.  The wound was inspected, subsequently no masses or other predicaments were noted in the ulnar bursa.  The wound was repaired with intradermal 3-0 Prolene and Steri- Strips.  A 2% lidocaine was infiltrated for postoperative comfort.  For aftercare, [name] will use Tylenol or other over-the-counter analgesics.  She is provided prescription for Tylenol with Codeine No. 3 one p.o. q.4-6 hours p.r.n. pain, 20 tablets to use as needed for more significant discomfort.  Charlotte [name], M.D.     RVS/MEDQ  D:  05/10/2013  T:  05/11/2013  Job:  [id number]

## 2013-05-12 ENCOUNTER — Encounter (HOSPITAL_BASED_OUTPATIENT_CLINIC_OR_DEPARTMENT_OTHER): Payer: Self-pay | Admitting: Orthopedic Surgery

## 2013-06-17 DIAGNOSIS — Z79899 Other long term (current) drug therapy: Secondary | ICD-10-CM | POA: Diagnosis not present

## 2013-06-17 DIAGNOSIS — H532 Diplopia: Secondary | ICD-10-CM | POA: Diagnosis not present

## 2013-06-17 DIAGNOSIS — E039 Hypothyroidism, unspecified: Secondary | ICD-10-CM | POA: Diagnosis not present

## 2013-06-17 DIAGNOSIS — E119 Type 2 diabetes mellitus without complications: Secondary | ICD-10-CM | POA: Diagnosis not present

## 2013-08-01 DIAGNOSIS — K625 Hemorrhage of anus and rectum: Secondary | ICD-10-CM | POA: Diagnosis not present

## 2013-08-01 DIAGNOSIS — Z86010 Personal history of colonic polyps: Secondary | ICD-10-CM | POA: Diagnosis not present

## 2013-08-01 DIAGNOSIS — Z1211 Encounter for screening for malignant neoplasm of colon: Secondary | ICD-10-CM | POA: Diagnosis not present

## 2013-08-08 DIAGNOSIS — H02409 Unspecified ptosis of unspecified eyelid: Secondary | ICD-10-CM | POA: Diagnosis not present

## 2013-08-08 DIAGNOSIS — H4920 Sixth [abducent] nerve palsy, unspecified eye: Secondary | ICD-10-CM | POA: Diagnosis not present

## 2013-08-08 DIAGNOSIS — H2510 Age-related nuclear cataract, unspecified eye: Secondary | ICD-10-CM | POA: Diagnosis not present

## 2013-08-23 DIAGNOSIS — G479 Sleep disorder, unspecified: Secondary | ICD-10-CM | POA: Diagnosis not present

## 2013-08-23 DIAGNOSIS — J310 Chronic rhinitis: Secondary | ICD-10-CM | POA: Diagnosis not present

## 2013-08-23 DIAGNOSIS — R0609 Other forms of dyspnea: Secondary | ICD-10-CM | POA: Diagnosis not present

## 2013-08-23 DIAGNOSIS — J342 Deviated nasal septum: Secondary | ICD-10-CM | POA: Diagnosis not present

## 2013-09-08 DIAGNOSIS — H2510 Age-related nuclear cataract, unspecified eye: Secondary | ICD-10-CM | POA: Diagnosis not present

## 2013-09-08 DIAGNOSIS — H538 Other visual disturbances: Secondary | ICD-10-CM | POA: Diagnosis not present

## 2013-09-08 DIAGNOSIS — H532 Diplopia: Secondary | ICD-10-CM | POA: Diagnosis not present

## 2013-09-08 DIAGNOSIS — H02429 Myogenic ptosis of unspecified eyelid: Secondary | ICD-10-CM | POA: Diagnosis not present

## 2013-10-17 ENCOUNTER — Ambulatory Visit (HOSPITAL_BASED_OUTPATIENT_CLINIC_OR_DEPARTMENT_OTHER): Payer: Medicare Other | Attending: Otolaryngology

## 2013-10-17 VITALS — Ht 64.0 in | Wt 125.0 lb

## 2013-10-17 DIAGNOSIS — R0609 Other forms of dyspnea: Secondary | ICD-10-CM | POA: Diagnosis not present

## 2013-10-17 DIAGNOSIS — I491 Atrial premature depolarization: Secondary | ICD-10-CM | POA: Insufficient documentation

## 2013-10-17 DIAGNOSIS — G4733 Obstructive sleep apnea (adult) (pediatric): Secondary | ICD-10-CM

## 2013-10-17 DIAGNOSIS — R0989 Other specified symptoms and signs involving the circulatory and respiratory systems: Secondary | ICD-10-CM | POA: Insufficient documentation

## 2013-10-17 DIAGNOSIS — G4710 Hypersomnia, unspecified: Secondary | ICD-10-CM | POA: Diagnosis not present

## 2013-10-17 DIAGNOSIS — G4730 Sleep apnea, unspecified: Principal | ICD-10-CM

## 2013-10-22 DIAGNOSIS — G4733 Obstructive sleep apnea (adult) (pediatric): Secondary | ICD-10-CM | POA: Diagnosis not present

## 2013-10-22 NOTE — Sleep Study (Signed)
   NAME: Charlotte [name] DATE OF BIRTH:  Oct 05, 1950 MEDICAL RECORD NUMBER [id number]  LOCATION: [facility]  PHYSICIAN: YOUNG,CLINTON D  DATE OF STUDY: 10/17/2013  SLEEP STUDY TYPE: Nocturnal Polysomnogram               REFERRING PHYSICIAN: Jerrell Belfast, MD  INDICATION FOR STUDY: Hypersomnia with sleep apnea  EPWORTH SLEEPINESS SCORE:   19/24 HEIGHT: 5\' 4"  (162.6 cm)  WEIGHT: 125 lb (56.7 kg)    Body mass index is 21.45 kg/(m^2).  NECK SIZE: 14.5 in.  MEDICATIONS: Charted for review  SLEEP ARCHITECTURE: Total sleep time 286.5 minutes with sleep efficiency 78.3%. Stage I was 7.5%, stage II 78%, stage III 0.3%, REM 14.1% of total sleep time. Sleep latency 11.5 minutes, REM latency 71 minutes, awake after sleep onset 68 minutes, arousal index 5.0, bedtime medication: None  RESPIRATORY DATA: Apnea hypopneas index (AHI) 3.1 per hour. 15 total events scored including 8 obstructive apneas and 7 hypopneas. Non-positional events. REM AHI 11.9 per hour. Most events came after 2 AM and were associated with REM. There were not enough early events to permit split protocol CPAP titration.  OXYGEN DATA: Mild snoring with oxygen desaturation to a nadir of 85% and a mean saturation of 96.3% on room air.  CARDIAC DATA: Sinus rhythm with PACs  MOVEMENT/PARASOMNIA: No significant movement disturbance, bathroom x1  IMPRESSION/ RECOMMENDATION:   1) Unremarkable sleep architecture for sleep center environment without bedtime medication. 2) Occasional respiratory events for sleep disturbance, within normal limits. AHI 3.1 per hour (the normal range for adults is an AHI from 0-5 events per hour). Mild snoring with oxygen desaturation to a nadir of 85% and mean saturation of 96.3% on room air.  Signed [name] M.D.  Deneise Lever Diplomate, American Board of Sleep Medicine  ELECTRONICALLY SIGNED ON:  10/22/2013, 10:34 AM [place] PH: [phone]    FX: [phone] [place]

## 2013-12-30 DIAGNOSIS — D1801 Hemangioma of skin and subcutaneous tissue: Secondary | ICD-10-CM | POA: Diagnosis not present

## 2013-12-30 DIAGNOSIS — L819 Disorder of pigmentation, unspecified: Secondary | ICD-10-CM | POA: Diagnosis not present

## 2013-12-30 DIAGNOSIS — D235 Other benign neoplasm of skin of trunk: Secondary | ICD-10-CM | POA: Diagnosis not present

## 2013-12-30 DIAGNOSIS — L299 Pruritus, unspecified: Secondary | ICD-10-CM | POA: Diagnosis not present

## 2013-12-30 DIAGNOSIS — L821 Other seborrheic keratosis: Secondary | ICD-10-CM | POA: Diagnosis not present

## 2014-01-24 DIAGNOSIS — Z23 Encounter for immunization: Secondary | ICD-10-CM | POA: Diagnosis not present

## 2014-01-24 DIAGNOSIS — Z136 Encounter for screening for cardiovascular disorders: Secondary | ICD-10-CM | POA: Diagnosis not present

## 2014-01-24 DIAGNOSIS — Z0000 Encounter for general adult medical examination without abnormal findings: Secondary | ICD-10-CM | POA: Diagnosis not present

## 2014-01-24 DIAGNOSIS — E039 Hypothyroidism, unspecified: Secondary | ICD-10-CM | POA: Diagnosis not present

## 2014-01-24 DIAGNOSIS — E119 Type 2 diabetes mellitus without complications: Secondary | ICD-10-CM | POA: Diagnosis not present

## 2014-01-24 DIAGNOSIS — E559 Vitamin D deficiency, unspecified: Secondary | ICD-10-CM | POA: Diagnosis not present

## 2014-01-24 DIAGNOSIS — K219 Gastro-esophageal reflux disease without esophagitis: Secondary | ICD-10-CM | POA: Diagnosis not present

## 2014-02-06 DIAGNOSIS — J310 Chronic rhinitis: Secondary | ICD-10-CM | POA: Diagnosis not present

## 2014-02-06 DIAGNOSIS — K219 Gastro-esophageal reflux disease without esophagitis: Secondary | ICD-10-CM | POA: Diagnosis not present

## 2014-02-06 DIAGNOSIS — G479 Sleep disorder, unspecified: Secondary | ICD-10-CM | POA: Diagnosis not present

## 2014-02-06 DIAGNOSIS — J342 Deviated nasal septum: Secondary | ICD-10-CM | POA: Diagnosis not present

## 2014-02-13 DIAGNOSIS — H11433 Conjunctival hyperemia, bilateral: Secondary | ICD-10-CM | POA: Diagnosis not present

## 2014-02-13 DIAGNOSIS — H02401 Unspecified ptosis of right eyelid: Secondary | ICD-10-CM | POA: Diagnosis not present

## 2014-02-13 DIAGNOSIS — H02402 Unspecified ptosis of left eyelid: Secondary | ICD-10-CM | POA: Diagnosis not present

## 2014-02-28 DIAGNOSIS — R197 Diarrhea, unspecified: Secondary | ICD-10-CM | POA: Diagnosis not present

## 2014-03-06 DIAGNOSIS — H53483 Generalized contraction of visual field, bilateral: Secondary | ICD-10-CM | POA: Diagnosis not present

## 2014-03-06 DIAGNOSIS — H02403 Unspecified ptosis of bilateral eyelids: Secondary | ICD-10-CM | POA: Diagnosis not present

## 2014-05-09 DIAGNOSIS — E559 Vitamin D deficiency, unspecified: Secondary | ICD-10-CM | POA: Diagnosis not present

## 2014-05-09 DIAGNOSIS — E119 Type 2 diabetes mellitus without complications: Secondary | ICD-10-CM | POA: Diagnosis not present

## 2014-05-09 DIAGNOSIS — K219 Gastro-esophageal reflux disease without esophagitis: Secondary | ICD-10-CM | POA: Diagnosis not present

## 2014-05-09 DIAGNOSIS — E039 Hypothyroidism, unspecified: Secondary | ICD-10-CM | POA: Diagnosis not present

## 2014-09-08 DIAGNOSIS — H02134 Senile ectropion of left upper eyelid: Secondary | ICD-10-CM | POA: Diagnosis not present

## 2014-09-08 DIAGNOSIS — H02132 Senile ectropion of right lower eyelid: Secondary | ICD-10-CM | POA: Diagnosis not present

## 2014-09-08 DIAGNOSIS — H53483 Generalized contraction of visual field, bilateral: Secondary | ICD-10-CM | POA: Diagnosis not present

## 2014-09-08 DIAGNOSIS — H11433 Conjunctival hyperemia, bilateral: Secondary | ICD-10-CM | POA: Diagnosis not present

## 2014-09-08 DIAGNOSIS — H02403 Unspecified ptosis of bilateral eyelids: Secondary | ICD-10-CM | POA: Diagnosis not present

## 2014-09-08 DIAGNOSIS — H02834 Dermatochalasis of left upper eyelid: Secondary | ICD-10-CM | POA: Diagnosis not present

## 2014-09-08 DIAGNOSIS — H02831 Dermatochalasis of right upper eyelid: Secondary | ICD-10-CM | POA: Diagnosis not present

## 2014-09-28 DIAGNOSIS — H04123 Dry eye syndrome of bilateral lacrimal glands: Secondary | ICD-10-CM | POA: Diagnosis not present

## 2014-10-20 DIAGNOSIS — L820 Inflamed seborrheic keratosis: Secondary | ICD-10-CM | POA: Diagnosis not present

## 2014-10-25 DIAGNOSIS — H04123 Dry eye syndrome of bilateral lacrimal glands: Secondary | ICD-10-CM | POA: Diagnosis not present

## 2014-11-06 DIAGNOSIS — E119 Type 2 diabetes mellitus without complications: Secondary | ICD-10-CM | POA: Diagnosis not present

## 2014-11-06 DIAGNOSIS — E559 Vitamin D deficiency, unspecified: Secondary | ICD-10-CM | POA: Diagnosis not present

## 2014-11-06 DIAGNOSIS — E039 Hypothyroidism, unspecified: Secondary | ICD-10-CM | POA: Diagnosis not present

## 2014-11-09 DIAGNOSIS — E119 Type 2 diabetes mellitus without complications: Secondary | ICD-10-CM | POA: Diagnosis not present

## 2014-11-09 DIAGNOSIS — K219 Gastro-esophageal reflux disease without esophagitis: Secondary | ICD-10-CM | POA: Diagnosis not present

## 2014-11-09 DIAGNOSIS — E039 Hypothyroidism, unspecified: Secondary | ICD-10-CM | POA: Diagnosis not present

## 2014-11-09 DIAGNOSIS — E559 Vitamin D deficiency, unspecified: Secondary | ICD-10-CM | POA: Diagnosis not present

## 2015-01-29 DIAGNOSIS — H11432 Conjunctival hyperemia, left eye: Secondary | ICD-10-CM | POA: Diagnosis not present

## 2015-01-29 DIAGNOSIS — H11431 Conjunctival hyperemia, right eye: Secondary | ICD-10-CM | POA: Diagnosis not present

## 2015-01-29 DIAGNOSIS — H16211 Exposure keratoconjunctivitis, right eye: Secondary | ICD-10-CM | POA: Diagnosis not present

## 2015-01-29 DIAGNOSIS — H16212 Exposure keratoconjunctivitis, left eye: Secondary | ICD-10-CM | POA: Diagnosis not present

## 2015-02-19 DIAGNOSIS — Z136 Encounter for screening for cardiovascular disorders: Secondary | ICD-10-CM | POA: Diagnosis not present

## 2015-02-19 DIAGNOSIS — E039 Hypothyroidism, unspecified: Secondary | ICD-10-CM | POA: Diagnosis not present

## 2015-02-19 DIAGNOSIS — E119 Type 2 diabetes mellitus without complications: Secondary | ICD-10-CM | POA: Diagnosis not present

## 2015-02-19 DIAGNOSIS — E559 Vitamin D deficiency, unspecified: Secondary | ICD-10-CM | POA: Diagnosis not present

## 2015-02-20 DIAGNOSIS — R1084 Generalized abdominal pain: Secondary | ICD-10-CM | POA: Diagnosis not present

## 2015-02-20 DIAGNOSIS — E039 Hypothyroidism, unspecified: Secondary | ICD-10-CM | POA: Diagnosis not present

## 2015-02-20 DIAGNOSIS — Z0000 Encounter for general adult medical examination without abnormal findings: Secondary | ICD-10-CM | POA: Diagnosis not present

## 2015-02-20 DIAGNOSIS — Z23 Encounter for immunization: Secondary | ICD-10-CM | POA: Diagnosis not present

## 2015-02-20 DIAGNOSIS — E559 Vitamin D deficiency, unspecified: Secondary | ICD-10-CM | POA: Diagnosis not present

## 2015-03-16 DIAGNOSIS — D1801 Hemangioma of skin and subcutaneous tissue: Secondary | ICD-10-CM | POA: Diagnosis not present

## 2015-03-16 DIAGNOSIS — L821 Other seborrheic keratosis: Secondary | ICD-10-CM | POA: Diagnosis not present

## 2015-03-16 DIAGNOSIS — L814 Other melanin hyperpigmentation: Secondary | ICD-10-CM | POA: Diagnosis not present

## 2015-03-27 DIAGNOSIS — N63 Unspecified lump in breast: Secondary | ICD-10-CM | POA: Diagnosis not present

## 2015-03-27 DIAGNOSIS — Z1231 Encounter for screening mammogram for malignant neoplasm of breast: Secondary | ICD-10-CM | POA: Diagnosis not present

## 2015-03-27 DIAGNOSIS — M8589 Other specified disorders of bone density and structure, multiple sites: Secondary | ICD-10-CM | POA: Diagnosis not present

## 2015-03-29 DIAGNOSIS — Z1231 Encounter for screening mammogram for malignant neoplasm of breast: Secondary | ICD-10-CM | POA: Diagnosis not present

## 2015-03-29 DIAGNOSIS — N63 Unspecified lump in breast: Secondary | ICD-10-CM | POA: Diagnosis not present

## 2015-03-29 DIAGNOSIS — M8589 Other specified disorders of bone density and structure, multiple sites: Secondary | ICD-10-CM | POA: Diagnosis not present

## 2015-04-05 ENCOUNTER — Other Ambulatory Visit: Payer: Self-pay | Admitting: Radiology

## 2015-04-05 DIAGNOSIS — M8589 Other specified disorders of bone density and structure, multiple sites: Secondary | ICD-10-CM | POA: Diagnosis not present

## 2015-04-05 DIAGNOSIS — Z0000 Encounter for general adult medical examination without abnormal findings: Secondary | ICD-10-CM | POA: Diagnosis not present

## 2015-04-05 DIAGNOSIS — Z1231 Encounter for screening mammogram for malignant neoplasm of breast: Secondary | ICD-10-CM | POA: Diagnosis not present

## 2015-04-05 DIAGNOSIS — R921 Mammographic calcification found on diagnostic imaging of breast: Secondary | ICD-10-CM | POA: Diagnosis not present

## 2015-04-05 DIAGNOSIS — C50911 Malignant neoplasm of unspecified site of right female breast: Secondary | ICD-10-CM | POA: Diagnosis not present

## 2015-04-10 ENCOUNTER — Other Ambulatory Visit: Payer: Self-pay | Admitting: General Surgery

## 2015-04-10 DIAGNOSIS — C50211 Malignant neoplasm of upper-inner quadrant of right female breast: Secondary | ICD-10-CM

## 2015-04-11 ENCOUNTER — Telehealth: Payer: Self-pay | Admitting: *Deleted

## 2015-04-11 ENCOUNTER — Telehealth: Payer: Self-pay | Admitting: Oncology

## 2015-04-11 NOTE — Telephone Encounter (Signed)
Mailed new pt packet to pt.  

## 2015-04-11 NOTE — Telephone Encounter (Signed)
New patient appt-s/w patient and gave np appt for 01/16 @ 4 w/Dr. Jana Hakim Referring Dr. Autumn Messing Dx- breast ca  Referral information scanned

## 2015-04-18 DIAGNOSIS — Z01411 Encounter for gynecological examination (general) (routine) with abnormal findings: Secondary | ICD-10-CM | POA: Diagnosis not present

## 2015-04-18 DIAGNOSIS — C50911 Malignant neoplasm of unspecified site of right female breast: Secondary | ICD-10-CM | POA: Diagnosis not present

## 2015-04-18 DIAGNOSIS — Z124 Encounter for screening for malignant neoplasm of cervix: Secondary | ICD-10-CM | POA: Diagnosis not present

## 2015-04-18 DIAGNOSIS — M8580 Other specified disorders of bone density and structure, unspecified site: Secondary | ICD-10-CM | POA: Diagnosis not present

## 2015-04-20 ENCOUNTER — Other Ambulatory Visit: Payer: Self-pay

## 2015-04-20 DIAGNOSIS — C50919 Malignant neoplasm of unspecified site of unspecified female breast: Secondary | ICD-10-CM
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
## 2015-04-23 ENCOUNTER — Other Ambulatory Visit (HOSPITAL_BASED_OUTPATIENT_CLINIC_OR_DEPARTMENT_OTHER): Payer: Medicare Other

## 2015-04-23 ENCOUNTER — Ambulatory Visit (HOSPITAL_BASED_OUTPATIENT_CLINIC_OR_DEPARTMENT_OTHER): Payer: Medicare Other | Admitting: Oncology

## 2015-04-23 VITALS — BP 114/54 | HR 177 | Temp 97.6°F | Resp 20 | Ht 64.0 in | Wt 130.7 lb

## 2015-04-23 DIAGNOSIS — C50919 Malignant neoplasm of unspecified site of unspecified female breast: Secondary | ICD-10-CM

## 2015-04-23 DIAGNOSIS — Z170 Estrogen receptor positive status [ER+]: Secondary | ICD-10-CM

## 2015-04-23 DIAGNOSIS — C50211 Malignant neoplasm of upper-inner quadrant of right female breast: Secondary | ICD-10-CM | POA: Diagnosis not present

## 2015-04-23 DIAGNOSIS — R203 Hyperesthesia: Secondary | ICD-10-CM

## 2015-04-23 DIAGNOSIS — E890 Postprocedural hypothyroidism: Secondary | ICD-10-CM

## 2015-04-23 DIAGNOSIS — N951 Menopausal and female climacteric states: Secondary | ICD-10-CM

## 2015-04-23 LAB — CBC WITH DIFFERENTIAL/PLATELET
BASO%: 1.1 % (ref 0.0–2.0)
Basophils Absolute: 0.1 10*3/uL (ref 0.0–0.1)
EOS%: 2.2 % (ref 0.0–7.0)
Eosinophils Absolute: 0.1 10*3/uL (ref 0.0–0.5)
HCT: 43.6 % (ref 34.8–46.6)
HGB: 14.2 g/dL (ref 11.6–15.9)
LYMPH%: 29.5 % (ref 14.0–49.7)
MCH: 28.4 pg (ref 25.1–34.0)
MCHC: 32.6 g/dL (ref 31.5–36.0)
MCV: 87.3 fL (ref 79.5–101.0)
MONO#: 0.5 10*3/uL (ref 0.1–0.9)
MONO%: 7.1 % (ref 0.0–14.0)
NEUT%: 60.1 % (ref 38.4–76.8)
NEUTROS ABS: 3.9 10*3/uL (ref 1.5–6.5)
PLATELETS: 215 10*3/uL (ref 145–400)
RBC: 4.99 10*6/uL (ref 3.70–5.45)
RDW: 13.1 % (ref 11.2–14.5)
WBC: 6.5 10*3/uL (ref 3.9–10.3)
lymph#: 1.9 10*3/uL (ref 0.9–3.3)

## 2015-04-23 LAB — COMPREHENSIVE METABOLIC PANEL
ALK PHOS: 75 U/L (ref 40–150)
ALT: 24 U/L (ref 0–55)
ANION GAP: 8 meq/L (ref 3–11)
AST: 25 U/L (ref 5–34)
Albumin: 4.1 g/dL (ref 3.5–5.0)
BILIRUBIN TOTAL: 0.38 mg/dL (ref 0.20–1.20)
BUN: 18 mg/dL (ref 7.0–26.0)
CO2: 26 meq/L (ref 22–29)
Calcium: 10.6 mg/dL — ABNORMAL HIGH (ref 8.4–10.4)
Chloride: 106 mEq/L (ref 98–109)
Creatinine: 0.9 mg/dL (ref 0.6–1.1)
EGFR: 64 mL/min/{1.73_m2} — ABNORMAL LOW (ref >90)
Glucose: 181 mg/dl — ABNORMAL HIGH (ref 70–140)
POTASSIUM: 4.5 meq/L (ref 3.5–5.1)
Sodium: 141 mEq/L (ref 136–145)
TOTAL PROTEIN: 6.8 g/dL (ref 6.4–8.3)

## 2015-04-23 MED ORDER — GABAPENTIN 100 MG PO CAPS: 100.0000 mg | ORAL_CAPSULE | Freq: Every day | ORAL | Status: DC

## 2015-04-23 MED ORDER — VENLAFAXINE HCL ER 37.5 MG PO CP24: 37.5000 mg | ORAL_CAPSULE | Freq: Every day | ORAL | Status: DC

## 2015-04-24 ENCOUNTER — Encounter: Payer: Self-pay | Admitting: *Deleted

## 2015-04-24 NOTE — Progress Notes (Signed)
Location of Breast Cancer: Right Breast Upper Inner Quadrant  Histology per Pathology Report: Diagnosis 04/05/15: Breast, right, needle core biopsy - INVASIVE DUCTAL CARCINOMA, MSBR GRADE 2 OF 3.   Receptor Status: ER(95% +), PR (90%+), Her2-neu (neg)  Did patient present with symptoms (if so, please note symptoms) or was this found on screening mammography?: B/L screening  03/27/15   Past/Anticipated interventions by surgeon, if any:Dr. Jovita Kussmaul, MD, Surgery 2/8.17  Past/Anticipated interventions by medical oncology, if any: Chemotherapy : Breast Clinic 04/23/15 Dr. Jana Hakim , Oncotype requested, breast conserving surgery with sentinel lymph node sampling pending,adjuvant radaition to follow,anti-estrogens to follow radiation follow up with Dr. Jana Hakim  2 weeks after surgery  Lymphedema issues, if any:    Pain issues, if any:    SAFETY ISSUES:  Prior radiation?  NO  Pacemaker/ICD? NO  Possible current pregnancy? NO  Is the patient on methotrexate?   Current Complaints / other details:   Menarche age 48,GXP2, 1st live birth age 52,HRT 73 years,d/c with dx breast ca Non smoker, drinks alcohol, no drug use    Rebecca Eaton, RN 04/24/2015,8:24 AM

## 2015-04-25 ENCOUNTER — Other Ambulatory Visit: Payer: Medicare Other

## 2015-04-25 ENCOUNTER — Telehealth: Payer: Self-pay | Admitting: Oncology

## 2015-04-25 ENCOUNTER — Encounter: Payer: Self-pay | Admitting: Genetic Counselor

## 2015-04-25 ENCOUNTER — Ambulatory Visit: Payer: Medicare Other

## 2015-04-25 ENCOUNTER — Ambulatory Visit (HOSPITAL_BASED_OUTPATIENT_CLINIC_OR_DEPARTMENT_OTHER): Payer: Medicare Other | Admitting: Genetic Counselor

## 2015-04-25 ENCOUNTER — Ambulatory Visit
Admission: RE | Admit: 2015-04-25 | Discharge: 2015-04-25 | Disposition: A | Discharge status: Home / Self Care | Payer: Medicare Other | Source: Ambulatory Visit | Attending: Radiation Oncology | Admitting: Radiation Oncology

## 2015-04-25 DIAGNOSIS — C50919 Malignant neoplasm of unspecified site of unspecified female breast: Secondary | ICD-10-CM

## 2015-04-25 DIAGNOSIS — Z170 Estrogen receptor positive status [ER+]: Secondary | ICD-10-CM | POA: Diagnosis not present

## 2015-04-25 DIAGNOSIS — Z510 Encounter for antineoplastic radiation therapy: Secondary | ICD-10-CM | POA: Insufficient documentation

## 2015-04-25 DIAGNOSIS — C50211 Malignant neoplasm of upper-inner quadrant of right female breast: Secondary | ICD-10-CM

## 2015-04-25 DIAGNOSIS — Z315 Encounter for genetic counseling: Secondary | ICD-10-CM

## 2015-04-25 NOTE — Telephone Encounter (Signed)
Spoke with patient and she is certain that the march dates are incorrect,i have left a message for Charlotte [name] to confirm and changed the appt
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
## 2015-04-26 ENCOUNTER — Encounter: Payer: Self-pay | Admitting: *Deleted

## 2015-04-26 NOTE — Progress Notes (Signed)
  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 female that had a mammogram showing abnormalities on 03/27/2015. In the right breast upper inner quadrant there was a 1.5 cm irregular high density mass with spiculated margins. She had an ultrasound on 03/29/2015 and this confirmed a mass. A stereotactic biopsy was performed on 04/05/2015, revealing an invasive ductal carcinoma, grade 2, ER positive at 95%, PR positive at 90%, and Her2-neu negative. She has met with Dr. Marlou Starks and Dr. Jana Hakim and is interested in breast preservation.  She has discussed oncotype testing with Dr. Jana Hakim.  She has a trip planned in late March and late April and would like to have her treatments in between that. She is accompanied by her husband. She is scheduled for a lumpectomy on 2/8.   PREVIOUS RADIATION THERAPY: No  Past medical, social and family history were reviewed in the electronic chart. Review of symptoms was reviewed in the electronic chart. Medications were reviewed in the electronic chart.   PHYSICAL EXAM:  Filed Vitals:   04/27/15 1448  BP: 123/43  Pulse: 55  Temp: 98.3 F (36.8 C)  .129 lb 6.4 oz (58.695 kg). Extremely sensitive breasts. No biopsy change, only a small biopsy site on the upper outer quadrant of the right breast. No palpable abnormalities of the right or left breast. No palpable cervical supraclavicular or axillary adenopathy.   IMPRESSION: [name] is a 65 yo female with Stage IA breast cancer of the upper inner quadrant of  the right breast.  PLAN: I spoke to the patient today regarding her diagnosis and options for treatment. We discussed the equivalence in terms of survival and local failure between mastectomy and breast conservation. We discussed the role of radiation in decreasing local failures in patients who undergo lumpectomy. We discussed the process of simulation and the placement tattoos. We discussed 4-6 weeks of treatment as an outpatient. We discussed the possibility of asymptomatic lung damage. We discussed the low likelihood of secondary malignancies. We discussed the possible side effects including but not limited to skin redness, fatigue, permanent skin darkening, and breast swelling.   She is going to have her surgery and then I will await the results of her oncotype test. If she is low risk, we can proceed with radiation at her convenience.   I spent 40 minutes  face to face with the patient and more than 50% of that time was spent in counseling and/or coordination of care.   ------------------------------------------------  Thea Silversmith, MD    This document serves as a record of services personally performed by Thea Silversmith, MD. It was created on her behalf by  Lendon Collar, a trained medical scribe. The creation of this record is based on the scribe's personal observations and the provider's statements to them. This document has been checked and approved by the attending provider.

## 2015-04-27 ENCOUNTER — Ambulatory Visit
Admission: RE | Admit: 2015-04-27 | Discharge: 2015-04-27 | Disposition: A | Discharge status: Home / Self Care | Payer: Medicare Other | Source: Ambulatory Visit | Attending: Radiation Oncology | Admitting: Radiation Oncology

## 2015-04-27 ENCOUNTER — Encounter: Payer: Self-pay | Admitting: *Deleted

## 2015-04-27 ENCOUNTER — Encounter: Payer: Self-pay | Admitting: Radiation Oncology

## 2015-04-27 ENCOUNTER — Ambulatory Visit
Admission: RE | Admit: 2015-04-27 | Discharge: 2015-04-27 | Disposition: A | Discharge status: Home / Self Care | Payer: Medicare Other | Source: Ambulatory Visit | Admitting: Radiation Oncology

## 2015-04-27 VITALS — BP 123/43 | HR 55 | Temp 98.3°F | Ht 64.0 in | Wt 129.4 lb

## 2015-04-27 DIAGNOSIS — C50211 Malignant neoplasm of upper-inner quadrant of right female breast: Secondary | ICD-10-CM

## 2015-04-27 DIAGNOSIS — Z170 Estrogen receptor positive status [ER+]: Secondary | ICD-10-CM | POA: Diagnosis not present

## 2015-04-27 DIAGNOSIS — Z510 Encounter for antineoplastic radiation therapy: Secondary | ICD-10-CM | POA: Diagnosis not present

## 2015-04-27 NOTE — Progress Notes (Signed)
Please see the Nurse Progress Note in the MD Initial Consult Encounter for this patient. 

## 2015-04-27 NOTE — Progress Notes (Deleted)
Location of Breast Cancer: Right Breast Upper Inner Quadrant  Histology per Pathology Report: Diagnosis 04/05/15: Breast, right, needle core biopsy - INVASIVE DUCTAL CARCINOMA, MSBR GRADE 2 OF 3.   Receptor Status: ER(95% +), PR (90%+), Her2-neu (neg)  Did patient present with symptoms (if so, please note symptoms) or was this found on screening mammography?: B/L screening 03/27/15   Past/Anticipated interventions by surgeon, if any:Dr. Jovita Kussmaul, MD, Surgery 2/8.17  Past/Anticipated interventions by medical oncology, if any: Chemotherapy : Breast Clinic 04/23/15 Dr. Jana Hakim , Oncotype requested, breast conserving surgery with sentinel lymph node sampling pending,adjuvant radaition to follow,anti-estrogens to follow radiation follow up with Dr. Jana Hakim 2 weeks after surgery  Lymphedema issues, if any: No  Pain issues, if any: No  SAFETY ISSUES:  Prior radiation? NO  Pacemaker/ICD? NO  Possible current pregnancy? NO  Is the patient on methotrexate?  Current Complaints / other details: Menarche age 52,GXP2, 1st live birth age 28,HRT 17 years,d/c with dx breast ca Non smoker, drinks alcohol, no drug use  03-27-15 Mammogram Rebecca Eaton, RN 04/24/2015,8:24 AM

## 2015-05-02 ENCOUNTER — Encounter: Payer: Medicare Other | Admitting: Genetic Counselor

## 2015-05-02 ENCOUNTER — Other Ambulatory Visit: Payer: Medicare Other

## 2015-05-03 ENCOUNTER — Encounter: Payer: Self-pay | Admitting: Genetic Counselor

## 2015-05-03 DIAGNOSIS — Z1379 Encounter for other screening for genetic and chromosomal anomalies: Secondary | ICD-10-CM | POA: Insufficient documentation

## 2015-05-03 NOTE — Addendum Note (Signed)
Encounter addended by: Benn Moulder, RN on: 05/03/2015  5:51 PM<BR>     Documentation filed: Charges VN

## 2015-05-07 ENCOUNTER — Telehealth: Payer: Self-pay | Admitting: Genetic Counselor

## 2015-05-07 NOTE — Telephone Encounter (Signed)
LM on VM with good news.  Explained that I will be out of the office tomorrow and Wed and will call her on Thursday.

## 2015-05-09 ENCOUNTER — Encounter (HOSPITAL_BASED_OUTPATIENT_CLINIC_OR_DEPARTMENT_OTHER): Payer: Self-pay | Admitting: *Deleted

## 2015-05-10 ENCOUNTER — Telehealth: Payer: Self-pay | Admitting: Genetic Counselor

## 2015-05-10 ENCOUNTER — Ambulatory Visit: Payer: Self-pay | Admitting: Genetic Counselor

## 2015-05-10 DIAGNOSIS — Z1379 Encounter for other screening for genetic and chromosomal anomalies: Secondary | ICD-10-CM

## 2015-05-10 DIAGNOSIS — C50919 Malignant neoplasm of unspecified site of unspecified female breast: Secondary | ICD-10-CM

## 2015-05-10 NOTE — Telephone Encounter (Signed)
Revealed negative genetic testing on the Ashkenazi Jewish ancestry.  Discussed that her brother could get tested but if on Medicare he would not get it covered.  His daughter's could undergo genetic testing.

## 2015-05-10 NOTE — Progress Notes (Signed)
HPI: Charlotte [name] was previously seen in the [place] clinic due to a personal history of cancer and Ashkenazi Jewish ancestry concerns regarding a hereditary predisposition to cancer. Please refer to our prior cancer genetics clinic note for more information regarding Charlotte [name]'s medical, social and family histories, and our assessment and recommendations, at the time. Charlotte [name] recent genetic test results were disclosed to her, as were recommendations warranted by these results. These results and recommendations are discussed in more detail below.  FAMILY HISTORY:  We obtained a detailed, 4-generation family history.  Significant diagnoses are listed below: Family History  Problem Relation Age of Onset  . Hypertension Mother   . Hypertension Father   . Stroke Maternal Aunt   . Breast cancer Cousin     dx in her 50s    The patient has two boys, one full brother and three paternal half brothers all who are cancer free. One half brother died in his 80s from a stroke. Both parents are deceased. The patient's father had a partial gastrectomy and died at 49. Her mother died of a "heart event" at age 32. Her mother had one brother and one sister, both are deceased. Neither had cancer. The patient's father had one sister, and three brothers, none had cancer. She reports that her father was an immigrant and his parents did not come to the [place] from [place] and she never knew them. Patient's maternal ancestors are of [place] descent, and paternal ancestors are of [place] descent. There is reported Ashkenazi Jewish ancestry. There is no known consanguinity.  GENETIC TEST RESULTS: At the time of Charlotte [name]'s visit, we recommended she pursue genetic testing of the Ashkenazi Jewish Founder mutation gene panel. The Ashkenazi Founder mutation panel performed by [facility] offers sequencing of the following three sites: BRCA1 c.68_69delAG (also known as 185delAG or 187delAGE), BRCA1  c.5266dupC (also known as 5382insC or 5385insC) and BRCA2 c.5946delT (also known as 6174delT).  The report date is April 26, 2015.  Genetic testing was normal, and did not reveal a deleterious mutation in these genes. The test report has been scanned into EPIC and is located under the Molecular Pathology section of the Results Review tab.   We discussed with Charlotte [name] that since the current genetic testing is not perfect, it is possible there may be a gene mutation in one of these genes that current testing cannot detect, but that chance is small. We also discussed, that it is possible that another gene that has not yet been discovered, or that we have not yet tested, is responsible for the cancer diagnoses in the family, and it is, therefore, important to remain in touch with cancer genetics in the future so that we can continue to offer Charlotte [name] the most up to date genetic testing.   CANCER SCREENING RECOMMENDATIONS: This result is reassuring and indicates that Charlotte [name] likely does not have an increased risk for a future cancer due to a mutation in one of these genes. This normal test also suggests that Charlotte [name]'s cancer was most likely not due to an inherited predisposition associated with one of these genes.  Most cancers happen by chance and this negative test suggests that her cancer falls into this category.  We, therefore, recommended she continue to follow the cancer management and screening guidelines provided by her oncology and primary healthcare provider.   RECOMMENDATIONS FOR FAMILY MEMBERS: Women in this family might be at some increased risk of developing cancer, over  the general population risk, simply due to the family history of cancer. We recommended women in this family have a yearly mammogram beginning at age 5, or 33 years younger than the earliest onset of cancer, an an annual clinical breast exam, and perform monthly breast self-exams. Women in this family should also have a  gynecological exam as recommended by their primary provider. All family members should have a colonoscopy by age 54.  FOLLOW-UP: Lastly, we discussed with Charlotte [name] that cancer genetics is a rapidly advancing field and it is possible that new genetic tests will be appropriate for her and/or her family members in the future. We encouraged her to remain in contact with cancer genetics on an annual basis so we can update her personal and family histories and let her know of advances in cancer genetics that may benefit this family.   Our contact number was provided. Charlotte [name] questions were answered to her satisfaction, and she knows she is welcome to call us at anytime with additional questions or concerns.   Charlotte Kayser, MS, [facility] Certified Genetic Counselor [name]

## 2015-05-14 DIAGNOSIS — Z1231 Encounter for screening mammogram for malignant neoplasm of breast: Secondary | ICD-10-CM | POA: Diagnosis not present

## 2015-05-14 DIAGNOSIS — Z0000 Encounter for general adult medical examination without abnormal findings: Secondary | ICD-10-CM | POA: Diagnosis not present

## 2015-05-14 DIAGNOSIS — M8589 Other specified disorders of bone density and structure, multiple sites: Secondary | ICD-10-CM | POA: Diagnosis not present

## 2015-05-14 DIAGNOSIS — C50211 Malignant neoplasm of upper-inner quadrant of right female breast: Secondary | ICD-10-CM | POA: Diagnosis not present

## 2015-05-16 ENCOUNTER — Ambulatory Visit (HOSPITAL_COMMUNITY)
Admission: RE | Admit: 2015-05-16 | Discharge: 2015-05-16 | Disposition: A | Discharge status: Home / Self Care | Payer: Medicare Other | Source: Ambulatory Visit | Attending: General Surgery | Admitting: General Surgery

## 2015-05-16 ENCOUNTER — Encounter (HOSPITAL_BASED_OUTPATIENT_CLINIC_OR_DEPARTMENT_OTHER)
Admission: RE | Disposition: A | Discharge status: Home / Self Care | Payer: Self-pay | Source: Ambulatory Visit | Attending: General Surgery

## 2015-05-16 ENCOUNTER — Ambulatory Visit (HOSPITAL_BASED_OUTPATIENT_CLINIC_OR_DEPARTMENT_OTHER): Payer: Medicare Other | Admitting: Certified Registered"

## 2015-05-16 ENCOUNTER — Ambulatory Visit (HOSPITAL_BASED_OUTPATIENT_CLINIC_OR_DEPARTMENT_OTHER)
Admission: RE | Admit: 2015-05-16 | Discharge: 2015-05-16 | Disposition: A | Discharge status: Home / Self Care | Payer: Medicare Other | Source: Ambulatory Visit | Attending: General Surgery | Admitting: General Surgery

## 2015-05-16 ENCOUNTER — Encounter (HOSPITAL_BASED_OUTPATIENT_CLINIC_OR_DEPARTMENT_OTHER): Payer: Self-pay | Admitting: Certified Registered"

## 2015-05-16 DIAGNOSIS — E039 Hypothyroidism, unspecified: Secondary | ICD-10-CM | POA: Diagnosis not present

## 2015-05-16 DIAGNOSIS — K219 Gastro-esophageal reflux disease without esophagitis: Secondary | ICD-10-CM | POA: Insufficient documentation

## 2015-05-16 DIAGNOSIS — M8589 Other specified disorders of bone density and structure, multiple sites: Secondary | ICD-10-CM | POA: Diagnosis not present

## 2015-05-16 DIAGNOSIS — C50211 Malignant neoplasm of upper-inner quadrant of right female breast: Secondary | ICD-10-CM

## 2015-05-16 DIAGNOSIS — E119 Type 2 diabetes mellitus without complications: Secondary | ICD-10-CM | POA: Diagnosis not present

## 2015-05-16 DIAGNOSIS — C50911 Malignant neoplasm of unspecified site of right female breast: Secondary | ICD-10-CM | POA: Insufficient documentation

## 2015-05-16 DIAGNOSIS — Z0000 Encounter for general adult medical examination without abnormal findings: Secondary | ICD-10-CM | POA: Diagnosis not present

## 2015-05-16 DIAGNOSIS — F419 Anxiety disorder, unspecified: Secondary | ICD-10-CM | POA: Insufficient documentation

## 2015-05-16 DIAGNOSIS — Z90710 Acquired absence of both cervix and uterus: Secondary | ICD-10-CM | POA: Insufficient documentation

## 2015-05-16 DIAGNOSIS — G4730 Sleep apnea, unspecified: Secondary | ICD-10-CM | POA: Diagnosis not present

## 2015-05-16 DIAGNOSIS — Z1231 Encounter for screening mammogram for malignant neoplasm of breast: Secondary | ICD-10-CM | POA: Diagnosis not present

## 2015-05-16 DIAGNOSIS — Z87891 Personal history of nicotine dependence: Secondary | ICD-10-CM | POA: Insufficient documentation

## 2015-05-16 DIAGNOSIS — Z79899 Other long term (current) drug therapy: Secondary | ICD-10-CM | POA: Insufficient documentation

## 2015-05-16 DIAGNOSIS — R079 Chest pain, unspecified: Secondary | ICD-10-CM | POA: Diagnosis not present

## 2015-05-16 DIAGNOSIS — G8918 Other acute postprocedural pain: Secondary | ICD-10-CM | POA: Diagnosis not present

## 2015-05-16 HISTORY — DX: Sleep apnea, unspecified: G47.30

## 2015-05-16 HISTORY — DX: Gastro-esophageal reflux disease without esophagitis: K21.9

## 2015-05-16 HISTORY — PX: BREAST LUMPECTOMY WITH RADIOACTIVE SEED AND SENTINEL LYMPH NODE BIOPSY: SHX6550

## 2015-05-16 LAB — POCT HEMOGLOBIN-HEMACUE: Hemoglobin: 14.4 g/dL (ref 12.0–15.0)

## 2015-05-16 SURGERY — BREAST LUMPECTOMY WITH RADIOACTIVE SEED AND SENTINEL LYMPH NODE BIOPSY
Anesthesia: Regional | Site: Breast | Laterality: Right

## 2015-05-16 MED ORDER — ACETAMINOPHEN 325 MG PO TABS
ORAL_TABLET | ORAL | Status: AC
  Filled 2015-05-16: qty 2

## 2015-05-16 MED ORDER — SCOPOLAMINE 1 MG/3DAYS TD PT72: 1.0000 | MEDICATED_PATCH | Freq: Once | TRANSDERMAL | Status: DC | PRN

## 2015-05-16 MED ORDER — BUPIVACAINE HCL (PF) 0.25 % IJ SOLN
INTRAMUSCULAR | Status: AC
  Filled 2015-05-16: qty 30

## 2015-05-16 MED ORDER — ACETAMINOPHEN 160 MG/5ML PO SOLN: 325.0000 mg | ORAL | Status: DC | PRN

## 2015-05-16 MED ORDER — PROPOFOL 500 MG/50ML IV EMUL
INTRAVENOUS | Status: AC
  Filled 2015-05-16: qty 50

## 2015-05-16 MED ORDER — PROPOFOL 10 MG/ML IV BOLUS
INTRAVENOUS | Status: DC | PRN
  Administered 2015-05-16: 40 mg via INTRAVENOUS
  Administered 2015-05-16: 100 mg via INTRAVENOUS

## 2015-05-16 MED ORDER — LIDOCAINE HCL (CARDIAC) 20 MG/ML IV SOLN
INTRAVENOUS | Status: AC
Start: 2015-05-16 — End: 2015-05-16
  Filled 2015-05-16: qty 5

## 2015-05-16 MED ORDER — CHLORHEXIDINE GLUCONATE 4 % EX LIQD: 1.0000 "application " | Freq: Once | CUTANEOUS | Status: DC

## 2015-05-16 MED ORDER — ACETAMINOPHEN 325 MG PO TABS
325.0000 mg | ORAL_TABLET | ORAL | Status: DC | PRN
  Administered 2015-05-16: 650 mg via ORAL

## 2015-05-16 MED ORDER — GLYCOPYRROLATE 0.2 MG/ML IJ SOLN: 0.2000 mg | Freq: Once | INTRAMUSCULAR | Status: DC | PRN

## 2015-05-16 MED ORDER — ONDANSETRON HCL 4 MG/2ML IJ SOLN
INTRAMUSCULAR | Status: AC
  Filled 2015-05-16: qty 2

## 2015-05-16 MED ORDER — LACTATED RINGERS IV SOLN
INTRAVENOUS | Status: DC
  Administered 2015-05-16 (×2): via INTRAVENOUS

## 2015-05-16 MED ORDER — FENTANYL CITRATE (PF) 100 MCG/2ML IJ SOLN
INTRAMUSCULAR | Status: AC
  Filled 2015-05-16: qty 2

## 2015-05-16 MED ORDER — LIDOCAINE HCL (CARDIAC) 20 MG/ML IV SOLN
INTRAVENOUS | Status: DC | PRN
  Administered 2015-05-16: 60 mg via INTRAVENOUS

## 2015-05-16 MED ORDER — FENTANYL CITRATE (PF) 100 MCG/2ML IJ SOLN
50.0000 ug | INTRAMUSCULAR | Status: AC | PRN
  Administered 2015-05-16 (×3): 50 ug via INTRAVENOUS

## 2015-05-16 MED ORDER — TECHNETIUM TC 99M SULFUR COLLOID FILTERED: 1.0000 | Freq: Once | INTRAVENOUS | Status: DC | PRN

## 2015-05-16 MED ORDER — MIDAZOLAM HCL 2 MG/2ML IJ SOLN
1.0000 mg | INTRAMUSCULAR | Status: DC | PRN
  Administered 2015-05-16 (×2): 1 mg via INTRAVENOUS

## 2015-05-16 MED ORDER — FENTANYL CITRATE (PF) 100 MCG/2ML IJ SOLN
25.0000 ug | INTRAMUSCULAR | Status: DC | PRN
  Administered 2015-05-16 (×2): 25 ug via INTRAVENOUS
  Administered 2015-05-16: 50 ug via INTRAVENOUS

## 2015-05-16 MED ORDER — OXYCODONE HCL 5 MG PO TABS: 5.0000 mg | ORAL_TABLET | Freq: Once | ORAL | Status: DC | PRN

## 2015-05-16 MED ORDER — OXYCODONE-ACETAMINOPHEN 5-325 MG PO TABS: 1.0000 | ORAL_TABLET | ORAL | Status: DC | PRN

## 2015-05-16 MED ORDER — BUPIVACAINE-EPINEPHRINE (PF) 0.5% -1:200000 IJ SOLN
INTRAMUSCULAR | Status: DC | PRN
  Administered 2015-05-16: 30 mL via PERINEURAL

## 2015-05-16 MED ORDER — CEFAZOLIN SODIUM-DEXTROSE 2-3 GM-% IV SOLR
2.0000 g | INTRAVENOUS | Status: AC
  Administered 2015-05-16: 2 g via INTRAVENOUS

## 2015-05-16 MED ORDER — ONDANSETRON HCL 4 MG/2ML IJ SOLN
INTRAMUSCULAR | Status: DC | PRN
  Administered 2015-05-16: 4 mg via INTRAVENOUS

## 2015-05-16 MED ORDER — DEXAMETHASONE SODIUM PHOSPHATE 4 MG/ML IJ SOLN
INTRAMUSCULAR | Status: DC | PRN
  Administered 2015-05-16: 10 mg via INTRAVENOUS

## 2015-05-16 MED ORDER — OXYCODONE HCL 5 MG/5ML PO SOLN: 5.0000 mg | Freq: Once | ORAL | Status: DC | PRN

## 2015-05-16 MED ORDER — CEFAZOLIN SODIUM-DEXTROSE 2-3 GM-% IV SOLR
INTRAVENOUS | Status: AC
  Filled 2015-05-16: qty 50

## 2015-05-16 MED ORDER — BUPIVACAINE HCL (PF) 0.25 % IJ SOLN
INTRAMUSCULAR | Status: DC | PRN
  Administered 2015-05-16: 9 mL
  Administered 2015-05-16: 13 mL

## 2015-05-16 MED ORDER — DEXAMETHASONE SODIUM PHOSPHATE 10 MG/ML IJ SOLN
INTRAMUSCULAR | Status: AC
  Filled 2015-05-16: qty 1

## 2015-05-16 MED ORDER — MIDAZOLAM HCL 2 MG/2ML IJ SOLN
INTRAMUSCULAR | Status: AC
  Filled 2015-05-16: qty 2

## 2015-05-16 MED ORDER — SODIUM CHLORIDE 0.9 % IJ SOLN
INTRAMUSCULAR | Status: AC
  Filled 2015-05-16: qty 10

## 2015-05-16 SURGICAL SUPPLY — 42 items
APPLIER CLIP 9.375 MED OPEN (MISCELLANEOUS) ×3
BLADE SURG 15 STRL LF DISP TIS (BLADE) ×1 IMPLANT
BLADE SURG 15 STRL SS (BLADE) ×2
CANISTER SUC SOCK COL 7IN (MISCELLANEOUS) IMPLANT
CANISTER SUCT 1200ML W/VALVE (MISCELLANEOUS) ×3 IMPLANT
CHLORAPREP W/TINT 26ML (MISCELLANEOUS) ×3 IMPLANT
CLIP APPLIE 9.375 MED OPEN (MISCELLANEOUS) ×1 IMPLANT
COVER BACK TABLE 60X90IN (DRAPES) ×3 IMPLANT
COVER MAYO STAND STRL (DRAPES) ×3 IMPLANT
COVER PROBE W GEL 5X96 (DRAPES) ×3 IMPLANT
DECANTER SPIKE VIAL GLASS SM (MISCELLANEOUS) IMPLANT
DEVICE DUBIN W/COMP PLATE 8390 (MISCELLANEOUS) ×3 IMPLANT
DRAPE LAPAROSCOPIC ABDOMINAL (DRAPES) ×3 IMPLANT
DRAPE UTILITY XL STRL (DRAPES) ×3 IMPLANT
ELECT COATED BLADE 2.86 ST (ELECTRODE) ×3 IMPLANT
ELECT REM PT RETURN 9FT ADLT (ELECTROSURGICAL) ×3
ELECTRODE REM PT RTRN 9FT ADLT (ELECTROSURGICAL) ×1 IMPLANT
GLOVE BIO SURGEON STRL SZ7.5 (GLOVE) ×3 IMPLANT
GLOVE BIOGEL PI IND STRL 7.0 (GLOVE) ×1 IMPLANT
GLOVE BIOGEL PI INDICATOR 7.0 (GLOVE) ×2
GLOVE ECLIPSE 6.5 STRL STRAW (GLOVE) ×3 IMPLANT
GOWN STRL REUS W/ TWL LRG LVL3 (GOWN DISPOSABLE) ×2 IMPLANT
GOWN STRL REUS W/TWL LRG LVL3 (GOWN DISPOSABLE) ×4
KIT MARKER MARGIN INK (KITS) ×3 IMPLANT
LIQUID BAND (GAUZE/BANDAGES/DRESSINGS) ×3 IMPLANT
NDL SAFETY ECLIPSE 18X1.5 (NEEDLE) IMPLANT
NEEDLE HYPO 18GX1.5 SHARP (NEEDLE)
NEEDLE HYPO 25X1 1.5 SAFETY (NEEDLE) ×3 IMPLANT
NS IRRIG 1000ML POUR BTL (IV SOLUTION) IMPLANT
PACK BASIN DAY SURGERY FS (CUSTOM PROCEDURE TRAY) ×3 IMPLANT
PENCIL BUTTON HOLSTER BLD 10FT (ELECTRODE) ×3 IMPLANT
SLEEVE SCD COMPRESS KNEE MED (MISCELLANEOUS) ×3 IMPLANT
SPONGE LAP 18X18 X RAY DECT (DISPOSABLE) ×3 IMPLANT
SUT MON AB 4-0 PC3 18 (SUTURE) ×9 IMPLANT
SUT SILK 2 0 SH (SUTURE) IMPLANT
SUT VICRYL 3-0 CR8 SH (SUTURE) ×3 IMPLANT
SYR CONTROL 10ML LL (SYRINGE) ×3 IMPLANT
TOWEL OR 17X24 6PK STRL BLUE (TOWEL DISPOSABLE) ×3 IMPLANT
TOWEL OR NON WOVEN STRL DISP B (DISPOSABLE) ×3 IMPLANT
TUBE CONNECTING 20'X1/4 (TUBING)
TUBE CONNECTING 20X1/4 (TUBING) IMPLANT
YANKAUER SUCT BULB TIP NO VENT (SUCTIONS) IMPLANT

## 2015-05-16 NOTE — Discharge Instructions (Signed)

## 2015-05-16 NOTE — Progress Notes (Signed)
Emotional support during breast injections °

## 2015-05-16 NOTE — Anesthesia Preprocedure Evaluation (Addendum)
Anesthesia Evaluation  Patient identified by MRN, date of birth, ID band Patient awake    Reviewed: Allergy & Precautions, NPO status , Patient's Chart, lab work & pertinent test results  History of Anesthesia Complications (+) PONV and history of anesthetic complications  Airway Mallampati: III  TM Distance: <3 FB Neck ROM: Full    Dental  (+) Teeth Intact   Pulmonary sleep apnea , former smoker,    breath sounds clear to auscultation       Cardiovascular negative cardio ROS   Rhythm:Regular     Neuro/Psych negative neurological ROS  negative psych ROS   GI/Hepatic GERD  ,  Endo/Other  Hypothyroidism   Renal/GU negative Renal ROS     Musculoskeletal   Abdominal   Peds  Hematology negative hematology ROS (+)   Anesthesia Other Findings   Reproductive/Obstetrics                            Anesthesia Physical Anesthesia Plan  ASA: II  Anesthesia Plan: General and Regional   Post-op Pain Management: MAC Combined w/ Regional for Post-op pain   Induction: Intravenous  Airway Management Planned: LMA  Additional Equipment: None  Intra-op Plan:   Post-operative Plan: Extubation in OR  Informed Consent: I have reviewed the patients History and Physical, chart, labs and discussed the procedure including the risks, benefits and alternatives for the proposed anesthesia with the patient or authorized representative who has indicated his/her understanding and acceptance.   Dental advisory given  Plan Discussed with: CRNA and Surgeon  Anesthesia Plan Comments:         Anesthesia Quick Evaluation

## 2015-05-16 NOTE — Progress Notes (Signed)
Assisted Dr. Moser with right, ultrasound guided, pectoralis block. Side rails up, monitors on throughout procedure. See vital signs in flow sheet. Tolerated Procedure well. 

## 2015-05-16 NOTE — H&P (Signed)
Charlotte [name]. Charlotte [name]  Location: [facility] Surgery Patient #: [id number] DOB: [birth year] Married / Language: English / Race: White Female   History of Present Illness  The patient is a 65 year old female who presents with breast cancer. We are asked to see the patient in consultation by Dr. Marcelo Baldy to evaluate her for a new right breast cancer. The patient is a 65 year old white female who recently went for a routine screening mammogram. At that time she was found to have a 1.5 cm mass in the upper inner quadrant of the right breast. She denies any breast pain or discharge from the nipple. She has no personal or family history of breast cancer. This was biopsied and came back as an invasive breast cancer. Her tumor marker panel is pending. She quit using her Vivelle-Dot about 1 week ago   Other Problems  Anxiety Disorder Breast Cancer Diabetes Mellitus Gastroesophageal Reflux Disease Lump In Breast Thyroid Disease  Past Surgical History  Breast Biopsy Right. Hysterectomy (not due to cancer) - Partial Spinal Surgery - Neck Thyroid Surgery Tonsillectomy  Diagnostic Studies History  Colonoscopy 1-5 years ago Mammogram within last year Pap Smear 1-5 years ago  Allergies  No Known Drug Allergies01/06/2015  Medication History  Tylenol (325MG  Tablet, Oral) Active. Vitamin D (Cholecalciferol) (1000UNIT Tablet, Oral) Active. Estradiol (0.0375MG /24HR Patch Weekly, Transdermal) Active. Levothyroxine Sodium (100MCG Tablet, Oral) Active. Multiple Vitamin (Oral) Active. Xanax (0.5MG  Tablet, Oral) Active. Medications Reconciled  Social History  Alcohol use Moderate alcohol use. Caffeine use Coffee, Tea. Illicit drug use Remotely quit drug use. Tobacco use Former smoker.  Family History  Heart Disease Father.  Pregnancy / Birth History  Age at menarche 19 years. Age of menopause 92-55 Gravida 2 Maternal age 1-25 Para 2    Review of  Systems  General Not Present- Appetite Loss, Chills, Fatigue, Fever, Night Sweats, Weight Gain and Weight Loss. Skin Not Present- Change in Wart/Mole, Dryness, Hives, Jaundice, New Lesions, Non-Healing Wounds, Rash and Ulcer. HEENT Not Present- Earache, Hearing Loss, Hoarseness, Nose Bleed, Oral Ulcers, Ringing in the Ears, Seasonal Allergies, Sinus Pain, Sore Throat, Visual Disturbances, Wears glasses/contact lenses and Yellow Eyes. Respiratory Not Present- Bloody sputum, Chronic Cough, Difficulty Breathing, Snoring and Wheezing. Breast Present- Breast Mass. Not Present- Breast Pain, Nipple Discharge and Skin Changes. Cardiovascular Not Present- Chest Pain, Difficulty Breathing Lying Down, Leg Cramps, Palpitations, Rapid Heart Rate, Shortness of Breath and Swelling of Extremities. Gastrointestinal Not Present- Abdominal Pain, Bloating, Bloody Stool, Change in Bowel Habits, Chronic diarrhea, Constipation, Difficulty Swallowing, Excessive gas, Gets full quickly at meals, Hemorrhoids, Indigestion, Nausea, Rectal Pain and Vomiting. Female Genitourinary Not Present- Frequency, Nocturia, Painful Urination, Pelvic Pain and Urgency. Musculoskeletal Not Present- Back Pain, Joint Pain, Joint Stiffness, Muscle Pain, Muscle Weakness and Swelling of Extremities. Neurological Not Present- Decreased Memory, Fainting, Headaches, Numbness, Seizures, Tingling, Tremor, Trouble walking and Weakness. Psychiatric Not Present- Anxiety, Bipolar, Change in Sleep Pattern, Depression, Fearful and Frequent crying. Endocrine Not Present- Cold Intolerance, Excessive Hunger, Hair Changes, Heat Intolerance, Hot flashes and New Diabetes. Hematology Not Present- Easy Bruising, Excessive bleeding, Gland problems, HIV and Persistent Infections.  Vitals Weight: 128.6 lb Height: 64in Body Surface Area: 1.62 m Body Mass Index: 22.07 kg/m  Temp.: 68F(Temporal)  Pulse: 54 (Regular)  BP: 128/72 (Sitting, Left Arm,  Standard)       Physical Exam  General Mental Status-Alert. General Appearance-Consistent with stated age. Hydration-Well hydrated. Voice-Normal.  Head and Neck Head-normocephalic, atraumatic with no lesions or palpable masses. Trachea-midline.  Thyroid Gland Characteristics - normal size and consistency.  Eye Eyeball - Bilateral-Extraocular movements intact. Sclera/Conjunctiva - Bilateral-No scleral icterus.  Chest and Lung Exam Chest and lung exam reveals -quiet, even and easy respiratory effort with no use of accessory muscles and on auscultation, normal breath sounds, no adventitious sounds and normal vocal resonance. Inspection Chest Wall - Normal. Back - normal.  Breast Note: There is no palpable mass in either breast. There is no palpable axillary, supraclavicular, or cervical lymphadenopathy.   Cardiovascular Cardiovascular examination reveals -normal heart sounds, regular rate and rhythm with no murmurs and normal pedal pulses bilaterally.  Abdomen Inspection Inspection of the abdomen reveals - No Hernias. Skin - Scar - no surgical scars. Palpation/Percussion Palpation and Percussion of the abdomen reveal - Soft, Non Tender, No Rebound tenderness, No Rigidity (guarding) and No hepatosplenomegaly. Auscultation Auscultation of the abdomen reveals - Bowel sounds normal.  Neurologic Neurologic evaluation reveals -alert and oriented x 3 with no impairment of recent or remote memory. Mental Status-Normal.  Musculoskeletal Normal Exam - Left-Upper Extremity Strength Normal and Lower Extremity Strength Normal. Normal Exam - Right-Upper Extremity Strength Normal and Lower Extremity Strength Normal.  Lymphatic Head & Neck  General Head & Neck Lymphatics: Bilateral - Description - Normal. Axillary  General Axillary Region: Bilateral - Description - Normal. Tenderness - Non Tender. Femoral & Inguinal  Generalized Femoral & Inguinal  Lymphatics: Bilateral - Description - Normal. Tenderness - Non Tender.    Assessment & Plan  BREAST CANCER OF UPPER-INNER QUADRANT OF RIGHT FEMALE BREAST (C50.211) Impression: The patient appears to have a stage I cancer of the upper inner quadrant of the right breast. I have talked her in detail about the different options for treatment and at this point she favors breast conservation. I think this is a very reasonable way of treating her breast cancer. Since her nodes are clinically negative she is also a good candidate for sentinel node mapping. I have discussed with her in detail the risks and benefits of the operation to do this as well as some of the technical aspects and she understands and wishes to proceed. I will plan for a right breast radioactive seed localized partial mastectomy with sentinel node mapping.    Signed by Luella Cook, MD

## 2015-05-16 NOTE — Anesthesia Postprocedure Evaluation (Signed)
Anesthesia Post Note  Patient: Charlotte [name]  Procedure(s) Performed: Procedure(s) (LRB): BREAST LUMPECTOMY WITH RADIOACTIVE SEED AND SENTINEL LYMPH NODE BIOPSY (Right)  Patient location during evaluation: PACU Anesthesia Type: General and Regional Level of consciousness: awake Pain management: pain level controlled Vital Signs Assessment: post-procedure vital signs reviewed and stable Respiratory status: spontaneous breathing and respiratory function stable Cardiovascular status: stable Postop Assessment: no signs of nausea or vomiting Anesthetic complications: no    Last Vitals:  Filed Vitals:   05/16/15 1145 05/16/15 1215  BP: 127/57   Pulse: 58 60  Temp: 36.4 C 36.4 C  Resp: 14 14    Last Pain:  Filed Vitals:   05/16/15 1217  PainSc: 2                  [place]

## 2015-05-16 NOTE — Transfer of Care (Signed)
Immediate Anesthesia Transfer of Care Note  Patient: Charlotte [name]  Procedure(s) Performed: Procedure(s): BREAST LUMPECTOMY WITH RADIOACTIVE SEED AND SENTINEL LYMPH NODE BIOPSY (Right)  Patient Location: PACU  Anesthesia Type:GA combined with regional for post-op pain  Level of Consciousness: awake and patient cooperative  Airway & Oxygen Therapy: Patient Spontanous Breathing and Patient connected to face mask oxygen  Post-op Assessment: Report given to RN and Post -op Vital signs reviewed and stable  Post vital signs: Reviewed and stable  Last Vitals:  Filed Vitals:   05/16/15 0828 05/16/15 0829  BP:    Pulse: 64 59  Temp:    Resp: 14 15    Complications: No apparent anesthesia complications

## 2015-05-16 NOTE — Anesthesia Procedure Notes (Addendum)
Anesthesia Regional Block:  Pectoralis block  Pre-Anesthetic Checklist: ,, timeout performed, Correct Patient, Correct Site, Correct Laterality, Correct Procedure, Correct Position, site marked, Risks and benefits discussed,  Surgical consent,  Pre-op evaluation,  At surgeon's request and post-op pain management  Laterality: Right  Prep: chloraprep       Needles:  Injection technique: Single-shot  Needle Type: Echogenic Stimulator Needle          Additional Needles:  Procedures: ultrasound guided (picture in chart) Pectoralis block Narrative:  Injection made incrementally with aspirations every 5 mL.  Performed by: Personally  Anesthesiologist: MOSER, CHRISTOPHER  Additional Notes: H+P and labs reviewed, risks and benefits discussed with patient, procedure tolerated well without complications   Procedure Name: LMA Insertion Date/Time: 05/16/2015 8:47 AM Performed by: Trinita Devlin D Pre-anesthesia Checklist: Patient identified, Emergency Drugs available, Suction available and Patient being monitored Patient Re-evaluated:Patient Re-evaluated prior to inductionOxygen Delivery Method: Circle System Utilized Preoxygenation: Pre-oxygenation with 100% oxygen Intubation Type: IV induction Ventilation: Mask ventilation without difficulty LMA: LMA inserted LMA Size: 4.0 Number of attempts: 1 Airway Equipment and Method: Bite block Placement Confirmation: positive ETCO2 Tube secured with: Tape Dental Injury: Teeth and Oropharynx as per pre-operative assessment

## 2015-05-16 NOTE — Op Note (Signed)
05/16/2015  10:14 AM  PATIENT:  Charlotte [name]  65 y.o. female  PRE-OPERATIVE DIAGNOSIS:  RIGHT BREAST CANCER  POST-OPERATIVE DIAGNOSIS:  RIGHT BREAST CANCER  PROCEDURE:  Procedure(s): BREAST LUMPECTOMY WITH RADIOACTIVE SEED AND SENTINEL LYMPH NODE BIOPSY (Right)  SURGEON:  Surgeon(s) and Role:    * Jovita Kussmaul, MD - Primary  PHYSICIAN ASSISTANT:   ASSISTANTS: none   ANESTHESIA:   general  EBL:  Total I/O In: 1400 [I.V.:1400] Out: -   BLOOD ADMINISTERED:none  DRAINS: none   LOCAL MEDICATIONS USED:  MARCAINE     SPECIMEN:  Source of Specimen:  right breast tissue and sentinel nodes  DISPOSITION OF SPECIMEN:  PATHOLOGY  COUNTS:  YES  TOURNIQUET:  * No tourniquets in log *  DICTATION: .Dragon Dictation   After informed consent was obtained the patient was brought to the operating room and placed in the supine position on the operating table. After adequate induction of general anesthesia the patient's right chest, breast, and axillary area were prepped with ChloraPrep, allowed to dry, and draped in usual sterile manner. Earlier in the day the patient underwent injection of 1 mCi of technetium sulfur colloid in the subareolar position on the right. Previously the patient had an I-125 seed placed in the upper portion of the right breast to mark an area of invasive breast cancer. The neoprobe was initially set to technetium.  An area of increased radioactivity was readily identified in the right axilla. A small transversely oriented incision was made with a 15 blade knife overlying the area of radioactivity. The incision was carried through the skin and subcutaneous tissue sharply with electrocautery until the axilla was entered. A [place] retractor was deployed. Using the neoprobe to direct blunt hemostat dissection I was able to identify 2 lymph nodes with increased radioactivity. Each of these was excised sharply with the electrocautery and the lymphatics were controlled with  clips. Ex vivo counts on sentinel node #1 were approximately 1400. Ex vivo counts on sentinel node #2 were approximately 400. There were no other hot or palpable lymph nodes in the right axilla. The wound was infiltrated with quarter percent Marcaine. The deep layer of the wound was then closed with interrupted 3-0 Vicryl stitches. The skin was then closed with a running 4-0 Monocryl subcuticular stitch. Attention was then turned to the right breast. The neoprobe was set to I-125.The area of the radioactive seed was identified in the upper portion of the right breast. An elliptical incision was made in the skin overlying the area of radioactivity with a 15 blade knife. The incision was carried through the skin and subcutaneous tissue sharply with the electrocautery. While checking the area of radioactivity frequently a circular portion of breast tissue was excised sharply around the radioactive seed. The dissection was carried all the way to the chest wall. Once the specimen was removed it was oriented with the appropriate paint colors. A specimen radiograph was obtained that showed the clip in seed to be in the center of the specimen. The specimen was then sent to pathology for further evaluation. An additional superior margin was removed from the cavity sharply with electrocautery and marked with the appropriate paint color on the new surgical margin. Hemostasis was achieved using the Bovie electrocautery. The wound was irrigated with copious amounts of saline and infiltrated with quarter percent Marcaine. The deep layer of the wound was then closed with interrupted 3-0 Vicryl stitches. The skin was then closed with interrupted 4-0 Monocryl subcuticular  stitches. Dermabond dressings were applied. The patient tolerated the procedure well. At the end of the case all needle sponge and instrument counts were correct. The patient was then awakened and taken to recovery in stable condition.  PLAN OF CARE: Discharge to  home after PACU  PATIENT DISPOSITION:  PACU - hemodynamically stable.   Delay start of Pharmacological VTE agent (>24hrs) due to surgical blood loss or risk of bleeding: not applicable

## 2015-05-16 NOTE — Interval H&P Note (Signed)
History and Physical Interval Note:  05/16/2015 7:48 AM  Charlotte [name]  has presented today for surgery, with the diagnosis of RIGHT BREAST CANCER  The various methods of treatment have been discussed with the patient and family. After consideration of risks, benefits and other options for treatment, the patient has consented to  Procedure(s): BREAST LUMPECTOMY WITH RADIOACTIVE SEED AND SENTINEL LYMPH NODE BIOPSY (Right) as a surgical intervention .  The patient's history has been reviewed, patient examined, no change in status, stable for surgery.  I have reviewed the patient's chart and labs.  Questions were answered to the patient's satisfaction.     [name],[name]

## 2015-05-17 ENCOUNTER — Encounter (HOSPITAL_BASED_OUTPATIENT_CLINIC_OR_DEPARTMENT_OTHER): Payer: Self-pay | Admitting: General Surgery

## 2015-05-22 ENCOUNTER — Telehealth: Payer: Self-pay | Admitting: *Deleted

## 2015-05-22 NOTE — Telephone Encounter (Signed)
Received order per Dr. Magrinat for oncotype testing. Requisition sent to pathology. Received by Tammy. 

## 2015-05-31 DIAGNOSIS — C50211 Malignant neoplasm of upper-inner quadrant of right female breast: Secondary | ICD-10-CM | POA: Diagnosis not present

## 2015-06-01 ENCOUNTER — Telehealth: Payer: Self-pay | Admitting: *Deleted

## 2015-06-01 ENCOUNTER — Encounter (HOSPITAL_COMMUNITY): Payer: Self-pay

## 2015-06-01 NOTE — Telephone Encounter (Signed)
Left message for a return phone call to discuss oncotype results. 

## 2015-06-04 ENCOUNTER — Encounter: Payer: Self-pay | Admitting: Radiation Oncology

## 2015-06-04 ENCOUNTER — Ambulatory Visit (HOSPITAL_BASED_OUTPATIENT_CLINIC_OR_DEPARTMENT_OTHER): Payer: Medicare Other | Admitting: Oncology

## 2015-06-04 ENCOUNTER — Ambulatory Visit: Payer: Medicare Other | Admitting: Oncology

## 2015-06-04 VITALS — BP 130/55 | HR 61 | Temp 97.6°F | Resp 18 | Ht 64.0 in | Wt 129.2 lb

## 2015-06-04 DIAGNOSIS — M8580 Other specified disorders of bone density and structure, unspecified site: Secondary | ICD-10-CM | POA: Diagnosis not present

## 2015-06-04 DIAGNOSIS — C50211 Malignant neoplasm of upper-inner quadrant of right female breast: Secondary | ICD-10-CM

## 2015-06-04 DIAGNOSIS — Z170 Estrogen receptor positive status [ER+]: Secondary | ICD-10-CM | POA: Diagnosis not present
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
## 2015-06-04 NOTE — Progress Notes (Signed)
Location of Breast Cancer:Breast Cancer Upper-Inner Quadrant Right Breast Histology per Pathology Report:  Diagnosis 05-16-15 1. Lymph node, sentinel, biopsy, Right axillary #1 - THERE IS NO EVIDENCE OF CARCINOMA IN 1 OF 1 LYMPH NODE (0/1). 2. Lymph node, sentinel, biopsy, Right axillary #2 - THERE IS NO EVIDENCE OF CARCINOMA IN 1 OF 1 LYMPH NODE (0/1). 3. Lymph node, sentinel, biopsy, Right axillary #3 - THERE IS NO EVIDENCE OF CARCINOMA IN 1 OF 1 LYMPH NODE (0/1). 4. Lymph node, sentinel, biopsy, Right axillary #4 - THERE IS NO EVIDENCE OF CARCINOMA IN 1 OF 1 LYMPH NODE (0/1). 5. Breast, lumpectomy, Right - INVASIVE DUCTAL CARCINOMA, GRADE I/III, SPANNING 1.6 CM. - DUCTAL CARCINOMA IN SITU, INTERMEDIATE GRADE. 1 of 4 Diagnosis(continued) - THE SURGICAL RESECTION MARGINS ARE NEGATIVE FOR CARCINOMA. - SEE ONCOLOGY TABLE BELOW. 6. Breast, excision, right, additional superior margin - BENIGN BREAST PARENCHYMA. - THERE IS NO EVIDENCE OF MALIGNANCY. - SEE COMMENT. Microscopic Comment 5. BREAST, INVASIVE TUMOR, WITH LYMPH NODES PRESENT Receptor Status: ER(95%), PR (90%), Her2-neu (-),Ki-67(10%) Charlotte [name] had bilateral screening mammography at [facility] 03/27/2015 this showed a mass in the right breast upper inner quadrant.  Past/Anticipated interventions by surgeon, if any:05-16-15  Right Breast lumpectomy with radioactive seed and sentinel lymph node biopsy   Past/Anticipated interventions by medical oncology, if any: Dr. Jana Hakim 05-16-15,Right Breast Biopsy,proceed to radiation. She will again in a few weeks and at that time we should be able to choose an appropriate antiestrogen for her. Lymphedema issues, if any:  No Pain issues, if any: 0  Has Tylenol for pain control. Skin: Right breast incision area healing wihtout signs of infection sensitive to touch. SAFETY ISSUES:  Prior radiation? : No  Pacemaker/ICD? : No  Possible current pregnancy:No  Is the patient on methotrexate?:  No  Current Complaints / other details:   Menarche age 71, first live birth age 56. The patient is GX P2. She stopped having periods in the 1990s and took hormone replacement for approximately 16 years, discontinuing this only at the time of breast cancer diagnosis December 2016.  BP 123/52 mmHg  Pulse 111  Temp(Src) 98.4 F (36.9 C) (Oral)  Ht _0  (1.626 m)  Wt 127 lb 6.4 oz (57.788 kg)  BMI 21.86 kg/m2  SpO2 98% Charlotte Spurling, RN 06/04/2015,10:53 AM

## 2015-06-06 ENCOUNTER — Ambulatory Visit
Admission: RE | Admit: 2015-06-06 | Discharge: 2015-06-06 | Disposition: A | Discharge status: Home / Self Care | Payer: Medicare Other | Source: Ambulatory Visit | Attending: Radiation Oncology | Admitting: Radiation Oncology

## 2015-06-06 ENCOUNTER — Encounter: Payer: Self-pay | Admitting: Radiation Oncology

## 2015-06-06 VITALS — BP 123/52 | HR 111 | Temp 98.4°F | Ht 64.0 in | Wt 127.4 lb

## 2015-06-06 DIAGNOSIS — C50211 Malignant neoplasm of upper-inner quadrant of right female breast: Secondary | ICD-10-CM

## 2015-06-06 DIAGNOSIS — Z510 Encounter for antineoplastic radiation therapy: Secondary | ICD-10-CM | POA: Diagnosis not present

## 2015-06-06 DIAGNOSIS — Z170 Estrogen receptor positive status [ER+]: Secondary | ICD-10-CM | POA: Diagnosis not present

## 2015-06-06 NOTE — Progress Notes (Signed)
   Department of Radiation Oncology  Phone:  [phone] Fax:        [phone]   Name: Charlotte [name] MRN: [id number]  DOB: Feb 06, 1951  Date: 06/06/2015  Follow Up Visit Note  Diagnosis: Breast cancer of upper-inner quadrant of right female breast [facility])   Staging form: Breast, AJCC 7th Edition     Clinical: Stage IA (T1c, N0, M0) - Signed by Chauncey Cruel, MD on 04/23/2015  Interval History: Charlotte [name] is a 65 y.o woman who presents today for a follow up prior to beginning radiation treatment. She underwent a right lumpectomy and sentinel lymph node biopsy on 05/16/15. 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 Four sentinel lymph nodes were negatve. Her margins are negative. She had Oncotype that was low risk for recurrence. She will not be receiving chemotherapy. She is healing well from surgery and is ready to begin radiation.   She denies any lymphedema or pain issues. She reports that her right breast incision area is healing without signs of infection. The incision site is currently sensitive to touch. She must be done with radiation by the end of April for a trip.   Previous radiation treatment: No  Physical Exam:  Filed Vitals:   06/06/15 1004  BP: 123/52  Pulse: 111  Temp: 98.4 F (36.9 C)  TempSrc: Oral  Height: 5\' 4"  (1.626 m)  Weight: 127 lb 6.4 oz (57.788 kg)  SpO2: 98%   This is a well appearing pleasant caucasian female in no acute distress. She is alert and oriented.  IMPRESSION: Charlotte [name] is a 65 y.o. 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 We discussed the equivalence in terms of survival and local failure between mastectomy and breast conservation. We discussed the role of radiation in decreasing local failures in patients who undergo lumpectomy. We discussed the process of simulation  and the placement tattoos. We discussed 16 treatments as an outpatient. We discussed the possibility of asymptomatic lung damage. We discussed the low likelihood of secondary malignancies. We discussed the possible side effects including but not limited to skin redness, fatigue, permanent skin darkening, and breast swelling. CT Simulation has been scheduled for 06/19/2015 at 10:00 am. Charlotte [name] has signed informed consent and is ready to proceed with radiation treatment.   Thea Silversmith, MD  This document serves as a record of services personally performed by Thea Silversmith, MD. It was created on her behalf by Jenell Milliner, a trained medical scribe. The creation of this record is based on the scribe's personal observations and the provider's statements to them. This document has been checked and approved by the attending provider.

## 2015-06-07 ENCOUNTER — Telehealth: Payer: Self-pay | Admitting: Oncology

## 2015-06-07 ENCOUNTER — Other Ambulatory Visit: Payer: Self-pay | Admitting: Oncology

## 2015-06-07 NOTE — Telephone Encounter (Signed)
cld pt and spoke to pt and gave pt time & date of appt for 4/21@9 

## 2015-06-19 ENCOUNTER — Ambulatory Visit
Admission: RE | Admit: 2015-06-19 | Discharge: 2015-06-19 | Disposition: A | Discharge status: Home / Self Care | Payer: Medicare Other | Source: Ambulatory Visit | Attending: Radiation Oncology | Admitting: Radiation Oncology

## 2015-06-19 DIAGNOSIS — C50211 Malignant neoplasm of upper-inner quadrant of right female breast: Secondary | ICD-10-CM | POA: Diagnosis not present

## 2015-06-19 DIAGNOSIS — Z170 Estrogen receptor positive status [ER+]: Secondary | ICD-10-CM | POA: Diagnosis not present

## 2015-06-19 DIAGNOSIS — Z510 Encounter for antineoplastic radiation therapy: Secondary | ICD-10-CM | POA: Diagnosis not present

## 2015-06-19 NOTE — Progress Notes (Signed)
Name: Charlotte [name]   MRN: [id number]  Date:  06/19/2015  DOB: 1950/07/18  Status:outpatient    DIAGNOSIS: Breast cancer.  CONSENT VERIFIED: yes   SET UP: Patient is setup supine   IMMOBILIZATION:  The following immobilization was used:Custom Moldable Pillow, breast board.   NARRATIVE: Ms. Aguillar was brought to the [facility].  Identity was confirmed.  All relevant records and images related to the planned course of therapy were reviewed.  Then, the patient was positioned in a stable reproducible clinical set-up for radiation therapy.  Wires were placed to delineate the clinical extent of breast tissue. A wire was placed on the scar as well.  CT images were obtained.  An isocenter was placed. Skin markings were placed.  The CT images were loaded into the planning software where the target and avoidance structures were contoured.  The radiation prescription was entered and confirmed. The patient was discharged in stable condition and tolerated simulation well.    TREATMENT PLANNING NOTE:  Treatment planning then occurred. I have requested : MLC's, isodose plan, basic dose calculation  I personally designed and supervised the construction of 5 medically necessary complex treatment devices for the protection of critical normal structures including the lungs and contralateral breast as well as the immobilization device which is necessary for set up certainty.   3D simulation occurred. I requested and analyzed a dose volume histogram of the heart, lungs and lumpectomy cavity.

## 2015-06-21 DIAGNOSIS — Z170 Estrogen receptor positive status [ER+]: Secondary | ICD-10-CM | POA: Diagnosis not present

## 2015-06-21 DIAGNOSIS — Z510 Encounter for antineoplastic radiation therapy: Secondary | ICD-10-CM | POA: Diagnosis not present

## 2015-06-21 DIAGNOSIS — C50211 Malignant neoplasm of upper-inner quadrant of right female breast: Secondary | ICD-10-CM | POA: Diagnosis not present

## 2015-06-26 ENCOUNTER — Ambulatory Visit
Admission: RE | Admit: 2015-06-26 | Discharge: 2015-06-26 | Disposition: A | Discharge status: Home / Self Care | Payer: Medicare Other | Source: Ambulatory Visit | Attending: Radiation Oncology | Admitting: Radiation Oncology

## 2015-06-26 DIAGNOSIS — C50211 Malignant neoplasm of upper-inner quadrant of right female breast: Secondary | ICD-10-CM | POA: Diagnosis not present

## 2015-06-26 DIAGNOSIS — Z510 Encounter for antineoplastic radiation therapy: Secondary | ICD-10-CM | POA: Diagnosis not present

## 2015-06-26 DIAGNOSIS — Z170 Estrogen receptor positive status [ER+]: Secondary | ICD-10-CM | POA: Diagnosis not present

## 2015-06-27 ENCOUNTER — Ambulatory Visit
Admission: RE | Admit: 2015-06-27 | Discharge: 2015-06-27 | Disposition: A | Discharge status: Home / Self Care | Payer: Medicare Other | Source: Ambulatory Visit | Attending: Radiation Oncology | Admitting: Radiation Oncology

## 2015-06-27 ENCOUNTER — Ambulatory Visit: Payer: Medicare Other | Admitting: Oncology

## 2015-06-27 DIAGNOSIS — Z170 Estrogen receptor positive status [ER+]: Secondary | ICD-10-CM | POA: Diagnosis not present

## 2015-06-27 DIAGNOSIS — Z510 Encounter for antineoplastic radiation therapy: Secondary | ICD-10-CM | POA: Diagnosis not present

## 2015-06-27 DIAGNOSIS — C50211 Malignant neoplasm of upper-inner quadrant of right female breast: Secondary | ICD-10-CM | POA: Diagnosis not present

## 2015-06-28 ENCOUNTER — Ambulatory Visit
Admission: RE | Admit: 2015-06-28 | Discharge: 2015-06-28 | Disposition: A | Discharge status: Home / Self Care | Payer: Medicare Other | Source: Ambulatory Visit | Attending: Radiation Oncology | Admitting: Radiation Oncology

## 2015-06-28 DIAGNOSIS — Z170 Estrogen receptor positive status [ER+]: Secondary | ICD-10-CM | POA: Diagnosis not present

## 2015-06-28 DIAGNOSIS — C50211 Malignant neoplasm of upper-inner quadrant of right female breast: Secondary | ICD-10-CM | POA: Diagnosis not present

## 2015-06-28 DIAGNOSIS — Z510 Encounter for antineoplastic radiation therapy: Secondary | ICD-10-CM | POA: Diagnosis not present

## 2015-06-29 ENCOUNTER — Ambulatory Visit
Admission: RE | Admit: 2015-06-29 | Discharge: 2015-06-29 | Disposition: A | Discharge status: Home / Self Care | Payer: Medicare Other | Source: Ambulatory Visit | Attending: Radiation Oncology | Admitting: Radiation Oncology

## 2015-06-29 DIAGNOSIS — C50211 Malignant neoplasm of upper-inner quadrant of right female breast: Secondary | ICD-10-CM | POA: Diagnosis not present

## 2015-06-29 DIAGNOSIS — Z170 Estrogen receptor positive status [ER+]: Secondary | ICD-10-CM | POA: Diagnosis not present

## 2015-06-29 DIAGNOSIS — Z510 Encounter for antineoplastic radiation therapy: Secondary | ICD-10-CM | POA: Diagnosis not present

## 2015-07-02 ENCOUNTER — Ambulatory Visit: Payer: Medicare Other | Admitting: Oncology

## 2015-07-02 ENCOUNTER — Ambulatory Visit: Payer: Medicare Other

## 2015-07-02 ENCOUNTER — Ambulatory Visit
Admission: RE | Admit: 2015-07-02 | Discharge: 2015-07-02 | Disposition: A | Discharge status: Home / Self Care | Payer: Medicare Other | Source: Ambulatory Visit | Attending: Radiation Oncology | Admitting: Radiation Oncology

## 2015-07-03 ENCOUNTER — Ambulatory Visit: Payer: Medicare Other | Admitting: Radiation Oncology

## 2015-07-03 ENCOUNTER — Ambulatory Visit: Payer: Medicare Other

## 2015-07-03 ENCOUNTER — Ambulatory Visit
Admission: RE | Admit: 2015-07-03 | Discharge: 2015-07-03 | Disposition: A | Discharge status: Home / Self Care | Payer: Medicare Other | Source: Ambulatory Visit | Attending: Radiation Oncology | Admitting: Radiation Oncology

## 2015-07-04 ENCOUNTER — Telehealth: Payer: Self-pay | Admitting: *Deleted

## 2015-07-04 ENCOUNTER — Ambulatory Visit
Admission: RE | Admit: 2015-07-04 | Discharge: 2015-07-04 | Disposition: A | Discharge status: Home / Self Care | Payer: Medicare Other | Source: Ambulatory Visit | Attending: Radiation Oncology | Admitting: Radiation Oncology

## 2015-07-04 DIAGNOSIS — Z510 Encounter for antineoplastic radiation therapy: Secondary | ICD-10-CM | POA: Diagnosis not present

## 2015-07-04 DIAGNOSIS — C50211 Malignant neoplasm of upper-inner quadrant of right female breast: Secondary | ICD-10-CM | POA: Diagnosis not present

## 2015-07-04 DIAGNOSIS — Z170 Estrogen receptor positive status [ER+]: Secondary | ICD-10-CM | POA: Diagnosis not present

## 2015-07-04 NOTE — Telephone Encounter (Signed)
Left message for a return phone call to follow after start of radiation.

## 2015-07-05 ENCOUNTER — Encounter: Payer: Self-pay | Admitting: Radiation Oncology

## 2015-07-05 ENCOUNTER — Ambulatory Visit
Admission: RE | Admit: 2015-07-05 | Discharge: 2015-07-05 | Disposition: A | Discharge status: Home / Self Care | Payer: Medicare Other | Source: Ambulatory Visit | Attending: Radiation Oncology | Admitting: Radiation Oncology

## 2015-07-05 ENCOUNTER — Ambulatory Visit
Admission: RE | Admit: 2015-07-05 | Discharge: 2015-07-05 | Disposition: A | Discharge status: Home / Self Care | Payer: Medicare Other | Source: Ambulatory Visit | Admitting: Radiation Oncology

## 2015-07-05 ENCOUNTER — Inpatient Hospital Stay: Admission: RE | Admit: 2015-07-05 | Payer: Self-pay | Source: Ambulatory Visit | Admitting: Radiation Oncology

## 2015-07-05 VITALS — BP 122/48 | HR 56 | Temp 97.6°F | Resp 18 | Ht 64.0 in

## 2015-07-05 DIAGNOSIS — Z510 Encounter for antineoplastic radiation therapy: Secondary | ICD-10-CM | POA: Diagnosis not present

## 2015-07-05 DIAGNOSIS — Z170 Estrogen receptor positive status [ER+]: Secondary | ICD-10-CM | POA: Diagnosis not present

## 2015-07-05 DIAGNOSIS — C50211 Malignant neoplasm of upper-inner quadrant of right female breast: Secondary | ICD-10-CM | POA: Diagnosis not present

## 2015-07-05 MED ORDER — RADIAPLEXRX EX GEL
Freq: Once | CUTANEOUS | Status: AC
  Administered 2015-07-05: 17:00:00 via TOPICAL

## 2015-07-05 MED ORDER — ALRA NON-METALLIC DEODORANT (RAD-ONC)
1.0000 "application " | Freq: Once | TOPICAL | Status: AC
  Administered 2015-07-05: 1 via TOPICAL

## 2015-07-05 NOTE — Progress Notes (Signed)
Charlotte [name] has received 5 fractions to her right breast.  Skin to right breast normal pink color.  Appetite is good. Energy level is good.  Denies pain or discomfort to her right breast. BP 122/48 mmHg  Pulse 56  Temp(Src) 97.6 F (36.4 C) (Oral)  Resp 18  Ht 5\' 4"  (1.626 m)  SpO2 100%  Pt here for patient teaching.  Pt given Radiation and you book.. Pt reports they have watched the Radiation Therapy Education video on July 05, 2015.  Reviewed areas of pertinence such as fatigue, hair loss, skin changes, breast tenderness and breast swelling .Asked to apply Alra deodorant 4 hours before her radiation treatment Pt able to give teach back of to pat skin, use unscented/gentle soap and drink plenty of water,apply Radiaplex bid, avoid applying anything to skin within 4 hours of treatment, avoid wearing an under wire bra and to use an electric razor if they must shave. Pt demonstrated understanding of information given and will contact nursing with any questions or concerns.     [email or web address]/[email or web address]

## 2015-07-05 NOTE — Progress Notes (Signed)
    Radiation Oncology         [phone] ________________________________  Name: Charlotte [name] MRN: [id number]  Date: 07/05/2015  DOB: [birth year]  Weekly Radiation Therapy Management  Current Dose: 13.35 Gy     Planned Dose:  42.72 Gy  Narrative: The patient presents for routine under treatment assessment. She has received 5 fractions to her breast. She reports a good energy level and appetite. She denies pain and discomfort to her right breast. Her skin looks perfect. She reports having hot flashes every hour and 1/2. She is not sleeping. She reports that she is up 5 times a night with hot flashes and nocturia. She reports that the medication that she was taking for hot flashes was not tolerated and caused nausea. Set-up films were reviewed. The chart was checked.  Physical Findings:  height is 5\' 4"  (1.626 m). Her oral temperature is 97.6 F (36.4 C). Her blood pressure is 122/48 and her pulse is 56. Her respiration is 18 and oxygen saturation is 100%.  Weight essentially stable.  Breast looks perfect on exam.  Impression: The patient is tolerating radiation.  Plan:  Continue treatment as planned. I have suggested evening primose and Melatonin to help with hot flashes and lack of sleep. RN education performed.   ------------------------------------------------  Thea Silversmith, MD  This document serves as a record of services personally performed by Thea Silversmith, MD. It was created on her behalf by Jenell Milliner, a trained medical scribe. The creation of this record is based on the scribe's personal observations and the provider's statements to them. This document has been checked and approved by the attending provider.

## 2015-07-06 ENCOUNTER — Ambulatory Visit
Admission: RE | Admit: 2015-07-06 | Discharge: 2015-07-06 | Disposition: A | Discharge status: Home / Self Care | Payer: Medicare Other | Source: Ambulatory Visit | Attending: Radiation Oncology | Admitting: Radiation Oncology

## 2015-07-06 DIAGNOSIS — Z510 Encounter for antineoplastic radiation therapy: Secondary | ICD-10-CM | POA: Diagnosis not present

## 2015-07-06 DIAGNOSIS — Z170 Estrogen receptor positive status [ER+]: Secondary | ICD-10-CM | POA: Diagnosis not present

## 2015-07-06 DIAGNOSIS — C50211 Malignant neoplasm of upper-inner quadrant of right female breast: Secondary | ICD-10-CM | POA: Diagnosis not present

## 2015-07-09 ENCOUNTER — Ambulatory Visit
Admission: RE | Admit: 2015-07-09 | Discharge: 2015-07-09 | Disposition: A | Discharge status: Home / Self Care | Payer: Medicare Other | Source: Ambulatory Visit | Attending: Radiation Oncology | Admitting: Radiation Oncology

## 2015-07-09 DIAGNOSIS — C50211 Malignant neoplasm of upper-inner quadrant of right female breast: Secondary | ICD-10-CM | POA: Diagnosis not present

## 2015-07-09 DIAGNOSIS — Z170 Estrogen receptor positive status [ER+]: Secondary | ICD-10-CM | POA: Diagnosis not present

## 2015-07-09 DIAGNOSIS — Z510 Encounter for antineoplastic radiation therapy: Secondary | ICD-10-CM | POA: Diagnosis not present

## 2015-07-10 ENCOUNTER — Ambulatory Visit
Admission: RE | Admit: 2015-07-10 | Discharge: 2015-07-10 | Disposition: A | Discharge status: Home / Self Care | Payer: Medicare Other | Source: Ambulatory Visit | Attending: Radiation Oncology | Admitting: Radiation Oncology

## 2015-07-10 DIAGNOSIS — C50211 Malignant neoplasm of upper-inner quadrant of right female breast: Secondary | ICD-10-CM

## 2015-07-10 DIAGNOSIS — Z510 Encounter for antineoplastic radiation therapy: Secondary | ICD-10-CM | POA: Diagnosis not present

## 2015-07-10 DIAGNOSIS — Z170 Estrogen receptor positive status [ER+]: Secondary | ICD-10-CM | POA: Diagnosis not present

## 2015-07-10 NOTE — Progress Notes (Signed)
Weekly Management Note Current Dose: 21.36  Gy  Projected Dose: 42.72 Gy   Narrative:  The patient presents for routine under treatment assessment.  CBCT/MVCT images/Port film x-rays were reviewed.  The chart was checked. Doing well. Taking primrose for hot flashes with no results yet.   Physical Findings: No skin changes.   Impression:  The patient is tolerating radiation.  Plan:  Continue treatment as planned. Continue radiaplex. Add melatonin if needed.

## 2015-07-11 ENCOUNTER — Ambulatory Visit
Admission: RE | Admit: 2015-07-11 | Discharge: 2015-07-11 | Disposition: A | Discharge status: Home / Self Care | Payer: Medicare Other | Source: Ambulatory Visit | Attending: Radiation Oncology | Admitting: Radiation Oncology

## 2015-07-11 DIAGNOSIS — C50211 Malignant neoplasm of upper-inner quadrant of right female breast: Secondary | ICD-10-CM | POA: Diagnosis not present

## 2015-07-11 DIAGNOSIS — Z170 Estrogen receptor positive status [ER+]: Secondary | ICD-10-CM | POA: Diagnosis not present

## 2015-07-11 DIAGNOSIS — Z510 Encounter for antineoplastic radiation therapy: Secondary | ICD-10-CM | POA: Diagnosis not present

## 2015-07-12 ENCOUNTER — Ambulatory Visit
Admission: RE | Admit: 2015-07-12 | Discharge: 2015-07-12 | Disposition: A | Discharge status: Home / Self Care | Payer: Medicare Other | Source: Ambulatory Visit | Attending: Radiation Oncology | Admitting: Radiation Oncology

## 2015-07-12 DIAGNOSIS — C50211 Malignant neoplasm of upper-inner quadrant of right female breast: Secondary | ICD-10-CM | POA: Diagnosis not present

## 2015-07-12 DIAGNOSIS — Z510 Encounter for antineoplastic radiation therapy: Secondary | ICD-10-CM | POA: Diagnosis not present

## 2015-07-12 DIAGNOSIS — Z170 Estrogen receptor positive status [ER+]: Secondary | ICD-10-CM | POA: Diagnosis not present

## 2015-07-13 ENCOUNTER — Ambulatory Visit
Admission: RE | Admit: 2015-07-13 | Discharge: 2015-07-13 | Disposition: A | Discharge status: Home / Self Care | Payer: Medicare Other | Source: Ambulatory Visit | Attending: Radiation Oncology | Admitting: Radiation Oncology

## 2015-07-13 DIAGNOSIS — C50211 Malignant neoplasm of upper-inner quadrant of right female breast: Secondary | ICD-10-CM | POA: Diagnosis not present

## 2015-07-13 DIAGNOSIS — Z510 Encounter for antineoplastic radiation therapy: Secondary | ICD-10-CM | POA: Diagnosis not present

## 2015-07-13 DIAGNOSIS — Z170 Estrogen receptor positive status [ER+]: Secondary | ICD-10-CM | POA: Diagnosis not present

## 2015-07-16 ENCOUNTER — Ambulatory Visit
Admission: RE | Admit: 2015-07-16 | Discharge: 2015-07-16 | Disposition: A | Discharge status: Home / Self Care | Payer: Medicare Other | Source: Ambulatory Visit | Attending: Radiation Oncology | Admitting: Radiation Oncology

## 2015-07-16 DIAGNOSIS — Z170 Estrogen receptor positive status [ER+]: Secondary | ICD-10-CM | POA: Diagnosis not present

## 2015-07-16 DIAGNOSIS — C50211 Malignant neoplasm of upper-inner quadrant of right female breast: Secondary | ICD-10-CM | POA: Diagnosis not present

## 2015-07-16 DIAGNOSIS — Z510 Encounter for antineoplastic radiation therapy: Secondary | ICD-10-CM | POA: Diagnosis not present

## 2015-07-17 ENCOUNTER — Ambulatory Visit
Admission: RE | Admit: 2015-07-17 | Discharge: 2015-07-17 | Disposition: A | Discharge status: Home / Self Care | Payer: Medicare Other | Source: Ambulatory Visit | Attending: Radiation Oncology | Admitting: Radiation Oncology

## 2015-07-17 ENCOUNTER — Encounter: Payer: Self-pay | Admitting: Radiation Oncology

## 2015-07-17 VITALS — BP 117/45 | HR 113 | Temp 98.1°F | Resp 18 | Ht 64.0 in | Wt 128.0 lb

## 2015-07-17 DIAGNOSIS — C50211 Malignant neoplasm of upper-inner quadrant of right female breast: Secondary | ICD-10-CM | POA: Diagnosis not present

## 2015-07-17 DIAGNOSIS — Z170 Estrogen receptor positive status [ER+]: Secondary | ICD-10-CM | POA: Diagnosis not present

## 2015-07-17 DIAGNOSIS — Z510 Encounter for antineoplastic radiation therapy: Secondary | ICD-10-CM | POA: Diagnosis not present

## 2015-07-17 NOTE — Progress Notes (Signed)
Charlotte [name] has received 13 fractions to right breast.  Tanning to right breast using Radiaplex gel.  Appetite is big.  Having fatigue mostly in the afternoon.  Denies pain.  EOT skin care discussed to continue use of Radiplex for two weeks until redness goes away.  Then start lotion with vitamin E.  Asked if she has an appointment with her medical oncologist.  Discussed that the survivorship nurse Charlotte [name] will meet with her a scheduler will call to schedule the appointment.  Reading material given for survivorship,FYNN and [employer].  Given a F/U appointment card to come back in one month to see Charlotte [name].  Patient was able to verbalize that she understood the instructions and will call with any nursing care questions. BP 117/45 mmHg  Pulse 113  Temp(Src) 98.1 F (36.7 C) (Oral)  Resp 18  Ht 5\' 4"  (1.626 m)  Wt 128 lb (58.06 kg)  BMI 21.96 kg/m2  SpO2 100%

## 2015-07-17 NOTE — Progress Notes (Signed)
Weekly Management Note Current Dose: 34.71  Gy  Projected Dose: 42.72 Gy   Narrative:  The patient presents for routine under treatment assessment.  CBCT/MVCT images/Port film x-rays were reviewed.  The chart was checked. Doing well. Taking primrose for hot flashes with moderate results (half of the hot flashes she was having before). Has appt with med onc next week. Nipple is sore.   Physical Findings: Pink skin.   Impression:  The patient is tolerating radiation.  Plan:  Continue treatment as planned. Continue radiaplex. Discussed post RT skin care. Follow up in 1 month.

## 2015-07-18 ENCOUNTER — Ambulatory Visit
Admission: RE | Admit: 2015-07-18 | Discharge: 2015-07-18 | Disposition: A | Discharge status: Home / Self Care | Payer: Medicare Other | Source: Ambulatory Visit | Attending: Radiation Oncology | Admitting: Radiation Oncology

## 2015-07-18 ENCOUNTER — Ambulatory Visit: Payer: Medicare Other

## 2015-07-18 DIAGNOSIS — C50211 Malignant neoplasm of upper-inner quadrant of right female breast: Secondary | ICD-10-CM | POA: Diagnosis not present

## 2015-07-18 DIAGNOSIS — Z510 Encounter for antineoplastic radiation therapy: Secondary | ICD-10-CM | POA: Diagnosis not present

## 2015-07-18 DIAGNOSIS — Z170 Estrogen receptor positive status [ER+]: Secondary | ICD-10-CM | POA: Diagnosis not present

## 2015-07-19 ENCOUNTER — Ambulatory Visit
Admission: RE | Admit: 2015-07-19 | Discharge: 2015-07-19 | Disposition: A | Discharge status: Home / Self Care | Payer: Medicare Other | Source: Ambulatory Visit | Attending: Radiation Oncology | Admitting: Radiation Oncology

## 2015-07-19 ENCOUNTER — Ambulatory Visit: Payer: Medicare Other

## 2015-07-19 DIAGNOSIS — Z510 Encounter for antineoplastic radiation therapy: Secondary | ICD-10-CM | POA: Diagnosis not present

## 2015-07-19 DIAGNOSIS — Z170 Estrogen receptor positive status [ER+]: Secondary | ICD-10-CM | POA: Diagnosis not present

## 2015-07-19 DIAGNOSIS — C50211 Malignant neoplasm of upper-inner quadrant of right female breast: Secondary | ICD-10-CM | POA: Diagnosis not present

## 2015-07-20 ENCOUNTER — Ambulatory Visit
Admission: RE | Admit: 2015-07-20 | Discharge: 2015-07-20 | Disposition: A | Discharge status: Home / Self Care | Payer: Medicare Other | Source: Ambulatory Visit | Attending: Radiation Oncology | Admitting: Radiation Oncology

## 2015-07-20 ENCOUNTER — Ambulatory Visit: Payer: Medicare Other

## 2015-07-20 ENCOUNTER — Encounter: Payer: Self-pay | Admitting: Radiation Oncology

## 2015-07-20 DIAGNOSIS — Z170 Estrogen receptor positive status [ER+]: Secondary | ICD-10-CM | POA: Diagnosis not present

## 2015-07-20 DIAGNOSIS — Z510 Encounter for antineoplastic radiation therapy: Secondary | ICD-10-CM | POA: Diagnosis not present

## 2015-07-20 DIAGNOSIS — C50211 Malignant neoplasm of upper-inner quadrant of right female breast: Secondary | ICD-10-CM | POA: Diagnosis not present

## 2015-07-23 ENCOUNTER — Telehealth: Payer: Self-pay | Admitting: *Deleted

## 2015-07-23 NOTE — Telephone Encounter (Signed)
Left vm for pt to return call regarding final xrt and to discuss survivorship program and referral. Contact information provided.

## 2015-07-26 DIAGNOSIS — G4700 Insomnia, unspecified: Secondary | ICD-10-CM | POA: Diagnosis not present

## 2015-07-26 DIAGNOSIS — E119 Type 2 diabetes mellitus without complications: Secondary | ICD-10-CM | POA: Diagnosis not present

## 2015-07-26 DIAGNOSIS — E039 Hypothyroidism, unspecified: Secondary | ICD-10-CM | POA: Diagnosis not present

## 2015-07-26 DIAGNOSIS — K219 Gastro-esophageal reflux disease without esophagitis: Secondary | ICD-10-CM | POA: Diagnosis not present

## 2015-07-27 ENCOUNTER — Telehealth: Payer: Self-pay | Admitting: Oncology

## 2015-07-27 ENCOUNTER — Ambulatory Visit (HOSPITAL_BASED_OUTPATIENT_CLINIC_OR_DEPARTMENT_OTHER): Payer: Medicare Other | Admitting: Oncology

## 2015-07-27 VITALS — BP 110/46 | HR 49 | Temp 98.0°F | Resp 18 | Ht 64.0 in | Wt 130.6 lb

## 2015-07-27 DIAGNOSIS — C50211 Malignant neoplasm of upper-inner quadrant of right female breast: Secondary | ICD-10-CM | POA: Diagnosis not present

## 2015-07-27 DIAGNOSIS — Z170 Estrogen receptor positive status [ER+]: Secondary | ICD-10-CM

## 2015-07-27 MED ORDER — TAMOXIFEN CITRATE 20 MG PO TABS: 20.0000 mg | ORAL_TABLET | Freq: Every day | ORAL | Status: AC
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
## 2015-07-27 NOTE — Telephone Encounter (Signed)
appt made and avs printed °

## 2015-08-03 ENCOUNTER — Other Ambulatory Visit: Payer: Self-pay | Admitting: Adult Health

## 2015-08-03 ENCOUNTER — Telehealth: Payer: Self-pay | Admitting: Oncology

## 2015-08-03 DIAGNOSIS — C50211 Malignant neoplasm of upper-inner quadrant of right female breast: Secondary | ICD-10-CM

## 2015-08-03 NOTE — Telephone Encounter (Signed)
S.w. Pt and advised on June appt...the patient ok and aware °

## 2015-08-03 NOTE — Telephone Encounter (Signed)
s.w. pt and avs for pt for June

## 2015-08-07 NOTE — Progress Notes (Signed)
  Radiation Oncology         [phone] ________________________________  Name: Charlotte [name] MRN: [id number]  Date: 07/20/2015  DOB: March 31, 1951  End of Treatment Note  Diagnosis:  Breast cancer of upper-inner quadrant of right female breast [facility])   Staging form: Breast, AJCC 7th Edition     Clinical: Stage IA (T1c, N0, M0) - Signed by Chauncey Cruel, MD on 04/23/2015    Indication for treatment:  Curative    Radiation treatment dates:   06/27/2015-07/20/2015  Site/dose:   The Right breast was treated to 42.720 Gy in 16 fractions at 2.670 Gy per fraction.   Beams/energy:  3D-Conformal / 6X   Narrative: The patient tolerated radiation treatment relatively well.  The patient experienced hot flashes and nipple soreness during treatment. Erythema present in the treatment area.   Plan: The patient has completed radiation treatment. The patient will return to radiation oncology clinic for routine followup in one month. I advised them to call or return sooner if they have any questions or concerns related to their recovery or treatment.  ------------------------------------------------  Thea Silversmith, MD  This document serves as a record of services personally performed by Thea Silversmith, MD. It was created on her behalf by Arlyce Harman, a trained medical scribe. The creation of this record is based on the scribe's personal observations and the provider's statements to them. This document has been checked and approved by the attending provider.

## 2015-08-14 NOTE — Progress Notes (Signed)
Mrs. is here for a one month for follow up visit for breast cancer of upper-inner quadrant of right female breast  Skin status:Normal Lotion being used: Using lotion with vitamin E Have you seen your medical oncologist? Date If not ,when is appointment Dr. Magrinat7-1-17 ER+,have started AI or Tamoxifen? If not, why?  Discuss survivorship appointment:09-13-15  [facility] Offer referral reading material for Survivorship, [employer] and [facility] given 07-17-15 Appetite:Good Pain:2/10 rest Arm mobility::Able to raise right arm without discomfort Energy level:Having fatigue most of the day BP 114/44 mmHg  Pulse 61  Temp(Src) 98.5 F (36.9 C) (Oral)  Resp 16  Ht 5\' 4"  (1.626 m)  Wt 130 lb (58.968 kg)  BMI 22.30 kg/m2  SpO2 98%

## 2015-08-15 DIAGNOSIS — H04123 Dry eye syndrome of bilateral lacrimal glands: Secondary | ICD-10-CM | POA: Diagnosis not present

## 2015-08-16 ENCOUNTER — Ambulatory Visit
Admission: RE | Admit: 2015-08-16 | Discharge: 2015-08-16 | Disposition: A | Discharge status: Home / Self Care | Payer: Medicare Other | Source: Ambulatory Visit | Attending: Radiation Oncology | Admitting: Radiation Oncology

## 2015-08-16 ENCOUNTER — Encounter: Payer: Self-pay | Admitting: Radiation Oncology

## 2015-08-16 VITALS — BP 114/44 | HR 61 | Temp 98.5°F | Resp 16 | Ht 64.0 in | Wt 130.0 lb

## 2015-08-16 DIAGNOSIS — C50211 Malignant neoplasm of upper-inner quadrant of right female breast: Secondary | ICD-10-CM

## 2015-08-16 DIAGNOSIS — H02532 Eyelid retraction right lower eyelid: Secondary | ICD-10-CM | POA: Diagnosis not present

## 2015-08-16 NOTE — Progress Notes (Signed)
   Department of Radiation Oncology  Phone:  [phone] Fax:        [phone]   Name: Charlotte [name] MRN: [id number]  DOB: Jun 04, 1950  Date: 08/16/2015  Follow Up Visit Note  Diagnosis: Breast cancer of upper-inner quadrant of right female breast [facility])   Staging form: Breast, AJCC 7th Edition     Clinical: Stage IA (T1c, N0, M0) - Signed by Chauncey Cruel, MD on 04/23/2015  Summary and Interval since last radiation: 1 month  06/27/2015-07/20/2015: The Right breast was treated to 42.720 Gy in 16 fractions at 2.670 Gy per fraction.  Interval History: Charlotte [name] presents today for routine followup. The patient followed up with Dr. Jana Hakim on 07/27/15 who recommended her to start Tamoxifen on 08/27/15. The patient reports cording and nerve pain underneath her right arm. She has been exercising and lifting 5 lb weights.  She continues to travel.   Physical Exam:  Filed Vitals:   08/16/15 1642  BP: 114/44  Pulse: 61  Temp: 98.5 F (36.9 C)  TempSrc: Oral  Resp: 16  Height: 5\' 4"  (1.626 m)  Weight: 130 lb (58.968 kg)  SpO2: 98%   cording in right axilla. Skin is well healed with minimal hyperpigmentation.   IMPRESSION: Charlotte [name] is a 65 y.o. female with stage IA invasive ductal carcinoma of the right breast.  PLAN: She is doing well. We discussed the need for follow up every 4-6 months which she has scheduled.  We discussed the need for yearly mammograms which she can schedule with her OBGYN or with medical oncology. We discussed the need for sun protection in the treated area.  She can always call me with questions.  I will follow up with her on an as needed basis.  We will refer her to physical therapy for evaluation or her right arm.    Thea Silversmith, MD  This document serves as a record of services personally performed by Thea Silversmith, MD. It was created on her behalf by Darcus Austin, a trained medical scribe. The creation of this record is based on the scribe's personal  observations and the provider's statements to them. This document has been checked and approved by the attending provider.

## 2015-08-17 ENCOUNTER — Telehealth: Payer: Self-pay | Admitting: *Deleted

## 2015-08-17 NOTE — Addendum Note (Signed)
Encounter addended by: Thea Silversmith, MD on: 08/17/2015  7:09 AM<BR>     Documentation filed: Arn Medal VN

## 2015-08-17 NOTE — Telephone Encounter (Signed)
CALLED PATIENT TO INFORM OF PT BEING RESCHEDULED FOR 08-24-15- ARRIVAL TIME - 10 AM @ [facility], SPOKE WITH PATIENT AND SHE IS AWARE OF THIS APPT.

## 2015-08-22 ENCOUNTER — Ambulatory Visit: Payer: Medicare Other | Admitting: Physical Therapy

## 2015-08-23 ENCOUNTER — Ambulatory Visit: Payer: Self-pay | Admitting: Radiation Oncology

## 2015-08-24 ENCOUNTER — Ambulatory Visit: Payer: Medicare Other | Attending: Radiation Oncology | Admitting: Physical Therapy

## 2015-08-24 DIAGNOSIS — L599 Disorder of the skin and subcutaneous tissue related to radiation, unspecified: Secondary | ICD-10-CM | POA: Insufficient documentation

## 2015-08-24 DIAGNOSIS — M25611 Stiffness of right shoulder, not elsewhere classified: Secondary | ICD-10-CM | POA: Insufficient documentation
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
## 2015-08-28 DIAGNOSIS — C50211 Malignant neoplasm of upper-inner quadrant of right female breast: Secondary | ICD-10-CM | POA: Diagnosis not present

## 2015-09-11 DIAGNOSIS — E039 Hypothyroidism, unspecified: Secondary | ICD-10-CM | POA: Diagnosis not present

## 2015-09-12 ENCOUNTER — Ambulatory Visit: Payer: Medicare Other | Attending: Radiation Oncology | Admitting: Physical Therapy

## 2015-09-12 DIAGNOSIS — L599 Disorder of the skin and subcutaneous tissue related to radiation, unspecified: Secondary | ICD-10-CM | POA: Insufficient documentation

## 2015-09-12 DIAGNOSIS — M25611 Stiffness of right shoulder, not elsewhere classified: Secondary | ICD-10-CM | POA: Diagnosis not present
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
## 2015-09-12 NOTE — Patient Instructions (Signed)
Stand with your right side toward a wall, arm's length from the wall.  Place your hand on the wall with fingers pointing back if possible. Straighten the elbow, then bend it slightly, and do this quickly for 30 repetitions.  You should feel a stretch in the arm each time you straighten the elbow. Do this twice a day if possible.  Flexors Stick Stretch I    Stand or sit, dowel in palm of arm to be stretched. Other arm, holding dowel at side and behind body, pushes arm being stretched forward and upward until straight over head. Hold _5__ seconds. Repeat __5_ times per session. Do _1__ sessions per day.  Copyright  VHI. All rights reserved.  Inferior Capsule Stick Stretch I    Stand or sit, dowel in palm of arm to be stretched. Other arm, holding dowel in front of body, pushes outward and upward until arm being stretched is as high to the side as possible. Hold _5__ seconds. Repeat _5__ times per session. Do __1_ sessions per day.  Copyright  VHI. All rights reserved.  Side Stretch, Standing Bend    Stand, one arm straight over head. Place other hand on hip and bend to that side as far as is comfortable. Hold __5_ seconds. Repeat _5__ times per session. Do _1__ sessions per day.  Copyright  VHI. All rights reserved.    When you do your stretch that brings the cord out in your underarm, stroke the cord with your other hand to stretch it a little bit.

## 2015-09-13 ENCOUNTER — Encounter: Payer: Medicare Other | Admitting: Nurse Practitioner

## 2015-09-13 ENCOUNTER — Telehealth: Payer: Self-pay | Admitting: Oncology

## 2015-09-13 NOTE — Telephone Encounter (Signed)
Gave and printed new sched for 6.13

## 2015-09-18 ENCOUNTER — Ambulatory Visit (HOSPITAL_BASED_OUTPATIENT_CLINIC_OR_DEPARTMENT_OTHER): Payer: Medicare Other | Admitting: Nurse Practitioner

## 2015-09-18 ENCOUNTER — Encounter: Payer: Self-pay | Admitting: Nurse Practitioner

## 2015-09-18 VITALS — BP 124/50 | HR 55 | Temp 98.3°F | Resp 18 | Ht 64.0 in | Wt 132.0 lb

## 2015-09-18 DIAGNOSIS — R232 Flushing: Secondary | ICD-10-CM | POA: Diagnosis not present

## 2015-09-18 DIAGNOSIS — E039 Hypothyroidism, unspecified: Secondary | ICD-10-CM | POA: Diagnosis not present

## 2015-09-18 DIAGNOSIS — Z170 Estrogen receptor positive status [ER+]: Secondary | ICD-10-CM | POA: Diagnosis not present

## 2015-09-18 DIAGNOSIS — E119 Type 2 diabetes mellitus without complications: Secondary | ICD-10-CM | POA: Diagnosis not present

## 2015-09-18 DIAGNOSIS — Z79810 Long term (current) use of selective estrogen receptor modulators (SERMs): Secondary | ICD-10-CM | POA: Diagnosis not present

## 2015-09-18 DIAGNOSIS — C50211 Malignant neoplasm of upper-inner quadrant of right female breast: Secondary | ICD-10-CM
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
## 2015-10-24 ENCOUNTER — Ambulatory Visit (HOSPITAL_BASED_OUTPATIENT_CLINIC_OR_DEPARTMENT_OTHER): Payer: Medicare Other | Admitting: Oncology

## 2015-10-24 ENCOUNTER — Other Ambulatory Visit (HOSPITAL_BASED_OUTPATIENT_CLINIC_OR_DEPARTMENT_OTHER): Payer: Medicare Other

## 2015-10-24 VITALS — BP 108/60 | HR 54 | Temp 98.4°F | Resp 18 | Ht 64.0 in | Wt 126.9 lb

## 2015-10-24 DIAGNOSIS — Z79810 Long term (current) use of selective estrogen receptor modulators (SERMs): Secondary | ICD-10-CM | POA: Diagnosis not present

## 2015-10-24 DIAGNOSIS — Z170 Estrogen receptor positive status [ER+]: Secondary | ICD-10-CM

## 2015-10-24 DIAGNOSIS — C50211 Malignant neoplasm of upper-inner quadrant of right female breast: Secondary | ICD-10-CM

## 2015-10-24 LAB — COMPREHENSIVE METABOLIC PANEL
ALBUMIN: 3.7 g/dL (ref 3.5–5.0)
ALK PHOS: 59 U/L (ref 40–150)
ALT: 24 U/L (ref 0–55)
AST: 26 U/L (ref 5–34)
Anion Gap: 8 mEq/L (ref 3–11)
BILIRUBIN TOTAL: 0.38 mg/dL (ref 0.20–1.20)
BUN: 20.2 mg/dL (ref 7.0–26.0)
CALCIUM: 10 mg/dL (ref 8.4–10.4)
CO2: 25 mEq/L (ref 22–29)
Chloride: 108 mEq/L (ref 98–109)
Creatinine: 0.9 mg/dL (ref 0.6–1.1)
EGFR: 64 mL/min/{1.73_m2} — AB (ref >90)
GLUCOSE: 111 mg/dL (ref 70–140)
POTASSIUM: 4.2 meq/L (ref 3.5–5.1)
SODIUM: 141 meq/L (ref 136–145)
TOTAL PROTEIN: 6.4 g/dL (ref 6.4–8.3)

## 2015-10-24 LAB — CBC WITH DIFFERENTIAL/PLATELET
BASO%: 1.3 % (ref 0.0–2.0)
Basophils Absolute: 0.1 10*3/uL (ref 0.0–0.1)
EOS%: 2.3 % (ref 0.0–7.0)
Eosinophils Absolute: 0.1 10*3/uL (ref 0.0–0.5)
HCT: 39.6 % (ref 34.8–46.6)
HGB: 13.6 g/dL (ref 11.6–15.9)
LYMPH%: 27.3 % (ref 14.0–49.7)
MCH: 29.6 pg (ref 25.1–34.0)
MCHC: 34.3 g/dL (ref 31.5–36.0)
MCV: 86.3 fL (ref 79.5–101.0)
MONO#: 0.4 10*3/uL (ref 0.1–0.9)
MONO%: 9 % (ref 0.0–14.0)
NEUT#: 2.4 10*3/uL (ref 1.5–6.5)
NEUT%: 60.1 % (ref 38.4–76.8)
Platelets: 183 10*3/uL (ref 145–400)
RBC: 4.59 10*6/uL (ref 3.70–5.45)
RDW: 12.5 % (ref 11.2–14.5)
WBC: 4 10*3/uL (ref 3.9–10.3)
lymph#: 1.1 10*3/uL (ref 0.9–3.3)
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
## 2015-11-01 DIAGNOSIS — L910 Hypertrophic scar: Secondary | ICD-10-CM | POA: Diagnosis not present

## 2015-12-28 ENCOUNTER — Emergency Department (HOSPITAL_BASED_OUTPATIENT_CLINIC_OR_DEPARTMENT_OTHER): Payer: Medicare Other

## 2015-12-28 ENCOUNTER — Encounter (HOSPITAL_BASED_OUTPATIENT_CLINIC_OR_DEPARTMENT_OTHER): Payer: Self-pay

## 2015-12-28 ENCOUNTER — Emergency Department (HOSPITAL_BASED_OUTPATIENT_CLINIC_OR_DEPARTMENT_OTHER)
Admission: EM | Admit: 2015-12-28 | Discharge: 2015-12-28 | Disposition: A | Discharge status: Home / Self Care | Payer: Medicare Other | Attending: Emergency Medicine | Admitting: Emergency Medicine

## 2015-12-28 DIAGNOSIS — E039 Hypothyroidism, unspecified: Secondary | ICD-10-CM | POA: Insufficient documentation

## 2015-12-28 DIAGNOSIS — Z87891 Personal history of nicotine dependence: Secondary | ICD-10-CM | POA: Diagnosis not present

## 2015-12-28 DIAGNOSIS — Z853 Personal history of malignant neoplasm of breast: Secondary | ICD-10-CM | POA: Diagnosis not present

## 2015-12-28 DIAGNOSIS — M62838 Other muscle spasm: Secondary | ICD-10-CM | POA: Diagnosis not present

## 2015-12-28 DIAGNOSIS — M542 Cervicalgia: Secondary | ICD-10-CM | POA: Diagnosis present

## 2015-12-28 MED ORDER — DIAZEPAM 2 MG PO TABS
2.0000 mg | ORAL_TABLET | Freq: Once | ORAL | Status: AC
  Administered 2015-12-28: 2 mg via ORAL
  Filled 2015-12-28: qty 1
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
## 2015-12-28 NOTE — ED Triage Notes (Signed)
Reports neck pain that started on Wednesday.  Patient states "sometimes she moves a certain way or does something and it causes her neck to hurt".  Also states "i have cold chills a lot and it causes my muscles to tense up"

## 2016-02-12 DIAGNOSIS — E039 Hypothyroidism, unspecified: Secondary | ICD-10-CM | POA: Diagnosis not present

## 2016-02-12 DIAGNOSIS — E119 Type 2 diabetes mellitus without complications: Secondary | ICD-10-CM | POA: Diagnosis not present

## 2016-02-12 DIAGNOSIS — E559 Vitamin D deficiency, unspecified: Secondary | ICD-10-CM | POA: Diagnosis not present

## 2016-02-19 DIAGNOSIS — K219 Gastro-esophageal reflux disease without esophagitis: Secondary | ICD-10-CM | POA: Diagnosis not present

## 2016-02-19 DIAGNOSIS — E119 Type 2 diabetes mellitus without complications: Secondary | ICD-10-CM | POA: Diagnosis not present

## 2016-02-19 DIAGNOSIS — E039 Hypothyroidism, unspecified: Secondary | ICD-10-CM | POA: Diagnosis not present

## 2016-03-03 DIAGNOSIS — H02531 Eyelid retraction right upper eyelid: Secondary | ICD-10-CM | POA: Diagnosis not present

## 2016-03-17 DIAGNOSIS — H01003 Unspecified blepharitis right eye, unspecified eyelid: Secondary | ICD-10-CM | POA: Diagnosis not present

## 2016-03-17 DIAGNOSIS — H01006 Unspecified blepharitis left eye, unspecified eyelid: Secondary | ICD-10-CM | POA: Diagnosis not present

## 2016-03-17 DIAGNOSIS — H2513 Age-related nuclear cataract, bilateral: Secondary | ICD-10-CM | POA: Diagnosis not present

## 2016-03-17 DIAGNOSIS — H04123 Dry eye syndrome of bilateral lacrimal glands: Secondary | ICD-10-CM | POA: Diagnosis not present

## 2016-03-18 DIAGNOSIS — C50211 Malignant neoplasm of upper-inner quadrant of right female breast: Secondary | ICD-10-CM | POA: Diagnosis not present

## 2016-04-03 DIAGNOSIS — R928 Other abnormal and inconclusive findings on diagnostic imaging of breast: Secondary | ICD-10-CM | POA: Diagnosis not present

## 2016-04-03 DIAGNOSIS — Z853 Personal history of malignant neoplasm of breast: Secondary | ICD-10-CM | POA: Diagnosis not present

## 2016-04-11 ENCOUNTER — Ambulatory Visit (INDEPENDENT_AMBULATORY_CARE_PROVIDER_SITE_OTHER): Payer: Medicare Other

## 2016-04-11 ENCOUNTER — Ambulatory Visit (HOSPITAL_COMMUNITY)
Admission: EM | Admit: 2016-04-11 | Discharge: 2016-04-11 | Disposition: A | Discharge status: Home / Self Care | Payer: Medicare Other | Attending: Family Medicine | Admitting: Family Medicine

## 2016-04-11 ENCOUNTER — Encounter (HOSPITAL_COMMUNITY): Payer: Self-pay | Admitting: Emergency Medicine

## 2016-04-11 DIAGNOSIS — S52122A Displaced fracture of head of left radius, initial encounter for closed fracture: Secondary | ICD-10-CM

## 2016-04-11 DIAGNOSIS — S52125A Nondisplaced fracture of head of left radius, initial encounter for closed fracture: Secondary | ICD-10-CM | POA: Diagnosis not present

## 2016-04-11 MED ORDER — HYDROCODONE-ACETAMINOPHEN 5-325 MG PO TABS: 1.0000 | ORAL_TABLET | Freq: Four times a day (QID) | ORAL | 0 refills | Status: DC | PRN

## 2016-04-11 MED ORDER — HYDROCODONE-ACETAMINOPHEN 5-325 MG PO TABS
1.0000 | ORAL_TABLET | Freq: Once | ORAL | Status: AC
  Administered 2016-04-11: 1 via ORAL

## 2016-04-11 MED ORDER — HYDROCODONE-ACETAMINOPHEN 5-325 MG PO TABS
ORAL_TABLET | ORAL | Status: AC
  Filled 2016-04-11: qty 1
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
## 2016-04-11 NOTE — ED Triage Notes (Signed)
Pt reports she inj her left arm onset today   States she tripped over and fell onto concrete flooring  Braced herself w/arms  A&O x4... NAD

## 2016-04-11 NOTE — ED Notes (Signed)
Pt reports pain is better    After pain  meds

## 2016-04-14 DIAGNOSIS — Z09 Encounter for follow-up examination after completed treatment for conditions other than malignant neoplasm: Secondary | ICD-10-CM | POA: Diagnosis not present

## 2016-04-14 DIAGNOSIS — H02531 Eyelid retraction right upper eyelid: Secondary | ICD-10-CM | POA: Diagnosis not present

## 2016-04-14 DIAGNOSIS — D485 Neoplasm of uncertain behavior of skin: Secondary | ICD-10-CM | POA: Diagnosis not present

## 2016-04-15 DIAGNOSIS — M2550 Pain in unspecified joint: Secondary | ICD-10-CM | POA: Diagnosis not present
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
## 2016-04-17 ENCOUNTER — Ambulatory Visit (INDEPENDENT_AMBULATORY_CARE_PROVIDER_SITE_OTHER): Payer: Medicare Other | Admitting: Orthopedic Surgery

## 2016-04-17 VITALS — Ht 64.0 in | Wt 130.0 lb

## 2016-04-17 DIAGNOSIS — S52125A Nondisplaced fracture of head of left radius, initial encounter for closed fracture: Secondary | ICD-10-CM | POA: Diagnosis not present

## 2016-04-29 DIAGNOSIS — J069 Acute upper respiratory infection, unspecified: Secondary | ICD-10-CM | POA: Diagnosis not present

## 2016-04-30 ENCOUNTER — Other Ambulatory Visit: Payer: Medicare Other

## 2016-05-05 ENCOUNTER — Other Ambulatory Visit: Payer: Medicare Other

## 2016-05-05 ENCOUNTER — Telehealth: Payer: Self-pay | Admitting: Oncology

## 2016-05-05 NOTE — Telephone Encounter (Signed)
Patient called to reschedule her appointments per she has the flu. Rescheduled to next available.

## 2016-05-06 ENCOUNTER — Ambulatory Visit (INDEPENDENT_AMBULATORY_CARE_PROVIDER_SITE_OTHER): Payer: Medicare Other | Admitting: Family

## 2016-05-06 ENCOUNTER — Encounter (INDEPENDENT_AMBULATORY_CARE_PROVIDER_SITE_OTHER): Payer: Self-pay | Admitting: Orthopedic Surgery

## 2016-05-06 ENCOUNTER — Ambulatory Visit (INDEPENDENT_AMBULATORY_CARE_PROVIDER_SITE_OTHER): Payer: Medicare Other

## 2016-05-06 VITALS — Ht 64.0 in | Wt 130.0 lb

## 2016-05-06 DIAGNOSIS — K625 Hemorrhage of anus and rectum: Secondary | ICD-10-CM | POA: Diagnosis not present

## 2016-05-06 DIAGNOSIS — K5904 Chronic idiopathic constipation: Secondary | ICD-10-CM | POA: Diagnosis not present

## 2016-05-06 DIAGNOSIS — R140 Abdominal distension (gaseous): Secondary | ICD-10-CM | POA: Diagnosis not present

## 2016-05-06 DIAGNOSIS — S52125A Nondisplaced fracture of head of left radius, initial encounter for closed fracture: Secondary | ICD-10-CM

## 2016-05-06 DIAGNOSIS — M25522 Pain in left elbow: Secondary | ICD-10-CM
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
## 2016-05-07 ENCOUNTER — Ambulatory Visit: Payer: Medicare Other | Admitting: Oncology

## 2016-05-08 DIAGNOSIS — R3915 Urgency of urination: Secondary | ICD-10-CM | POA: Diagnosis not present

## 2016-05-08 DIAGNOSIS — N390 Urinary tract infection, site not specified: Secondary | ICD-10-CM | POA: Diagnosis not present

## 2016-05-12 ENCOUNTER — Ambulatory Visit: Payer: Medicare Other | Attending: Family

## 2016-05-12 DIAGNOSIS — M25632 Stiffness of left wrist, not elsewhere classified: Secondary | ICD-10-CM | POA: Insufficient documentation

## 2016-05-12 DIAGNOSIS — M25622 Stiffness of left elbow, not elsewhere classified: Secondary | ICD-10-CM | POA: Diagnosis not present

## 2016-05-12 DIAGNOSIS — X58XXXD Exposure to other specified factors, subsequent encounter: Secondary | ICD-10-CM | POA: Diagnosis not present

## 2016-05-12 DIAGNOSIS — M25611 Stiffness of right shoulder, not elsewhere classified: Secondary | ICD-10-CM | POA: Insufficient documentation

## 2016-05-12 DIAGNOSIS — M79632 Pain in left forearm: Secondary | ICD-10-CM | POA: Insufficient documentation

## 2016-05-12 DIAGNOSIS — S52125D Nondisplaced fracture of head of left radius, subsequent encounter for closed fracture with routine healing: Secondary | ICD-10-CM | POA: Diagnosis not present

## 2016-05-12 NOTE — Patient Instructions (Signed)
Issued from cabinet Lt elbow and wrist ROM active and passive 3-4x/day, 10-20 reps or 30-60 sec stretching.
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
## 2016-05-20 ENCOUNTER — Other Ambulatory Visit (HOSPITAL_BASED_OUTPATIENT_CLINIC_OR_DEPARTMENT_OTHER): Payer: Medicare Other

## 2016-05-20 ENCOUNTER — Ambulatory Visit: Payer: Medicare Other

## 2016-05-20 ENCOUNTER — Telehealth: Payer: Self-pay | Admitting: *Deleted

## 2016-05-20 DIAGNOSIS — M25622 Stiffness of left elbow, not elsewhere classified: Secondary | ICD-10-CM | POA: Diagnosis not present

## 2016-05-20 DIAGNOSIS — M79632 Pain in left forearm: Secondary | ICD-10-CM | POA: Diagnosis not present

## 2016-05-20 DIAGNOSIS — S52125D Nondisplaced fracture of head of left radius, subsequent encounter for closed fracture with routine healing: Secondary | ICD-10-CM | POA: Diagnosis not present

## 2016-05-20 DIAGNOSIS — C50211 Malignant neoplasm of upper-inner quadrant of right female breast: Secondary | ICD-10-CM | POA: Diagnosis not present

## 2016-05-20 DIAGNOSIS — M25611 Stiffness of right shoulder, not elsewhere classified: Secondary | ICD-10-CM | POA: Diagnosis not present

## 2016-05-20 DIAGNOSIS — M25632 Stiffness of left wrist, not elsewhere classified: Secondary | ICD-10-CM

## 2016-05-20 LAB — CBC WITH DIFFERENTIAL/PLATELET
BASO%: 0.3 % (ref 0.0–2.0)
BASOS ABS: 0 10*3/uL (ref 0.0–0.1)
EOS ABS: 0.1 10*3/uL (ref 0.0–0.5)
EOS%: 2.3 % (ref 0.0–7.0)
HCT: 36.1 % (ref 34.8–46.6)
HGB: 11.7 g/dL (ref 11.6–15.9)
LYMPH%: 20.7 % (ref 14.0–49.7)
MCH: 28.7 pg (ref 25.1–34.0)
MCHC: 32.4 g/dL (ref 31.5–36.0)
MCV: 88.6 fL (ref 79.5–101.0)
MONO#: 0.4 10*3/uL (ref 0.1–0.9)
MONO%: 7.1 % (ref 0.0–14.0)
NEUT%: 69.6 % (ref 38.4–76.8)
NEUTROS ABS: 3.4 10*3/uL (ref 1.5–6.5)
Platelets: 303 10*3/uL (ref 145–400)
RBC: 4.07 10*6/uL (ref 3.70–5.45)
RDW: 13.4 % (ref 11.2–14.5)
WBC: 4.9 10*3/uL (ref 3.9–10.3)
lymph#: 1 10*3/uL (ref 0.9–3.3)

## 2016-05-20 LAB — COMPREHENSIVE METABOLIC PANEL
ALT: 13 U/L (ref 0–55)
AST: 15 U/L (ref 5–34)
Albumin: 3.2 g/dL — ABNORMAL LOW (ref 3.5–5.0)
Alkaline Phosphatase: 62 U/L (ref 40–150)
Anion Gap: 10 mEq/L (ref 3–11)
BUN: 23.2 mg/dL (ref 7.0–26.0)
CHLORIDE: 108 meq/L (ref 98–109)
CO2: 23 mEq/L (ref 22–29)
CREATININE: 1.2 mg/dL — AB (ref 0.6–1.1)
Calcium: 9.9 mg/dL (ref 8.4–10.4)
EGFR: 47 mL/min/{1.73_m2} — ABNORMAL LOW (ref >90)
Glucose: 196 mg/dl — ABNORMAL HIGH (ref 70–140)
Potassium: 4 mEq/L (ref 3.5–5.1)
Sodium: 140 mEq/L (ref 136–145)
TOTAL PROTEIN: 6.7 g/dL (ref 6.4–8.3)
Total Bilirubin: 0.34 mg/dL (ref 0.20–1.20)

## 2016-05-20 NOTE — Telephone Encounter (Signed)
"  Do I need to fast before today's lab tests?"   Our office does not require fasting for these test.  Denies being diabetic but glucose has been high the past few weeks.  For CBC, CMET one week before MD F/U.   For personal inquiry of fasting labs she can fast.  "I'll see my GP  next month"

## 2016-05-20 NOTE — Patient Instructions (Signed)
Gentle self overpressure wrist and elbow all ranges with mild pain to tolerance
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
## 2016-05-21 ENCOUNTER — Ambulatory Visit (INDEPENDENT_AMBULATORY_CARE_PROVIDER_SITE_OTHER): Payer: Medicare Other | Admitting: Orthopedic Surgery

## 2016-05-27 ENCOUNTER — Ambulatory Visit: Payer: Medicare Other

## 2016-05-27 ENCOUNTER — Ambulatory Visit (HOSPITAL_BASED_OUTPATIENT_CLINIC_OR_DEPARTMENT_OTHER): Payer: Medicare Other | Admitting: Oncology

## 2016-05-27 VITALS — BP 124/35 | HR 64 | Temp 98.1°F | Resp 18 | Ht 64.0 in | Wt 128.2 lb

## 2016-05-27 DIAGNOSIS — S52125D Nondisplaced fracture of head of left radius, subsequent encounter for closed fracture with routine healing: Secondary | ICD-10-CM | POA: Diagnosis not present

## 2016-05-27 DIAGNOSIS — Z170 Estrogen receptor positive status [ER+]: Secondary | ICD-10-CM

## 2016-05-27 DIAGNOSIS — M25622 Stiffness of left elbow, not elsewhere classified: Secondary | ICD-10-CM | POA: Diagnosis not present

## 2016-05-27 DIAGNOSIS — C50211 Malignant neoplasm of upper-inner quadrant of right female breast: Secondary | ICD-10-CM

## 2016-05-27 DIAGNOSIS — M25611 Stiffness of right shoulder, not elsewhere classified: Secondary | ICD-10-CM

## 2016-05-27 DIAGNOSIS — M79632 Pain in left forearm: Secondary | ICD-10-CM | POA: Diagnosis not present

## 2016-05-27 DIAGNOSIS — M25632 Stiffness of left wrist, not elsewhere classified: Secondary | ICD-10-CM

## 2016-05-27 MED ORDER — TAMOXIFEN CITRATE 20 MG PO TABS: 20.0000 mg | ORAL_TABLET | Freq: Every day | ORAL | 12 refills | Status: AC
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
## 2016-05-28 ENCOUNTER — Encounter (HOSPITAL_COMMUNITY): Payer: Self-pay

## 2016-05-30 DIAGNOSIS — D225 Melanocytic nevi of trunk: Secondary | ICD-10-CM | POA: Diagnosis not present

## 2016-05-30 DIAGNOSIS — D2261 Melanocytic nevi of right upper limb, including shoulder: Secondary | ICD-10-CM | POA: Diagnosis not present

## 2016-05-30 DIAGNOSIS — D1801 Hemangioma of skin and subcutaneous tissue: Secondary | ICD-10-CM | POA: Diagnosis not present

## 2016-05-30 DIAGNOSIS — L821 Other seborrheic keratosis: Secondary | ICD-10-CM | POA: Diagnosis not present

## 2016-05-30 DIAGNOSIS — L814 Other melanin hyperpigmentation: Secondary | ICD-10-CM | POA: Diagnosis not present

## 2016-05-30 DIAGNOSIS — D2262 Melanocytic nevi of left upper limb, including shoulder: Secondary | ICD-10-CM | POA: Diagnosis not present

## 2016-06-03 ENCOUNTER — Ambulatory Visit: Payer: Medicare Other

## 2016-06-03 DIAGNOSIS — M25632 Stiffness of left wrist, not elsewhere classified: Secondary | ICD-10-CM

## 2016-06-03 DIAGNOSIS — M25622 Stiffness of left elbow, not elsewhere classified: Secondary | ICD-10-CM | POA: Diagnosis not present

## 2016-06-03 DIAGNOSIS — S52125D Nondisplaced fracture of head of left radius, subsequent encounter for closed fracture with routine healing: Secondary | ICD-10-CM

## 2016-06-03 DIAGNOSIS — M25611 Stiffness of right shoulder, not elsewhere classified: Secondary | ICD-10-CM | POA: Diagnosis not present

## 2016-06-03 DIAGNOSIS — M79632 Pain in left forearm: Secondary | ICD-10-CM | POA: Diagnosis not present
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
## 2016-06-04 ENCOUNTER — Encounter (INDEPENDENT_AMBULATORY_CARE_PROVIDER_SITE_OTHER): Payer: Self-pay | Admitting: Orthopedic Surgery

## 2016-06-04 ENCOUNTER — Ambulatory Visit (INDEPENDENT_AMBULATORY_CARE_PROVIDER_SITE_OTHER): Payer: Medicare Other | Admitting: Orthopedic Surgery

## 2016-06-04 VITALS — Ht 64.0 in | Wt 128.0 lb

## 2016-06-04 DIAGNOSIS — S52135D Nondisplaced fracture of neck of left radius, subsequent encounter for closed fracture with routine healing: Secondary | ICD-10-CM

## 2016-06-04 DIAGNOSIS — S52135A Nondisplaced fracture of neck of left radius, initial encounter for closed fracture: Secondary | ICD-10-CM | POA: Insufficient documentation
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
## 2016-06-10 ENCOUNTER — Ambulatory Visit: Payer: Medicare Other

## 2016-06-24 DIAGNOSIS — E119 Type 2 diabetes mellitus without complications: Secondary | ICD-10-CM | POA: Diagnosis not present

## 2016-06-24 DIAGNOSIS — E039 Hypothyroidism, unspecified: Secondary | ICD-10-CM | POA: Diagnosis not present

## 2016-07-01 DIAGNOSIS — N183 Chronic kidney disease, stage 3 (moderate): Secondary | ICD-10-CM | POA: Diagnosis not present

## 2016-07-01 DIAGNOSIS — E119 Type 2 diabetes mellitus without complications: Secondary | ICD-10-CM | POA: Diagnosis not present

## 2016-07-01 DIAGNOSIS — E039 Hypothyroidism, unspecified: Secondary | ICD-10-CM | POA: Diagnosis not present

## 2016-07-01 DIAGNOSIS — K219 Gastro-esophageal reflux disease without esophagitis: Secondary | ICD-10-CM | POA: Diagnosis not present

## 2016-08-15 ENCOUNTER — Other Ambulatory Visit: Payer: Self-pay

## 2016-08-15 MED ORDER — TAMOXIFEN CITRATE 10 MG PO TABS: 10.0000 mg | ORAL_TABLET | Freq: Two times a day (BID) | ORAL | 0 refills | Status: DC

## 2016-08-15 NOTE — Progress Notes (Signed)
Returned pt call regarding concerns of taking tamoxifen 10mg  instead of 20mg . Pt states that her side effects are getting in the way of her work. Pt states that she only takes tamoxifen and synthroid. She is experiencing some tremors, weight gain and shakes. Pt stopped tamoxifen for a few days and her "shakes" improved immediately. Pt got back on tamoxifen and is now experiencing some shaking again. Advised pt to stop tamoxifen for a week and restart with 10mg  instead of 20mg . Will send a new script for pt until she sees Dr.Magrinat in June. Pt verbalized understanding and appreciative of advice.No further questions at this time.

## 2016-09-16 DIAGNOSIS — J209 Acute bronchitis, unspecified: Secondary | ICD-10-CM | POA: Diagnosis not present

## 2016-09-16 DIAGNOSIS — J019 Acute sinusitis, unspecified: Secondary | ICD-10-CM | POA: Diagnosis not present

## 2016-09-17 DIAGNOSIS — H01009 Unspecified blepharitis unspecified eye, unspecified eyelid: Secondary | ICD-10-CM | POA: Diagnosis not present

## 2016-09-17 DIAGNOSIS — H04123 Dry eye syndrome of bilateral lacrimal glands: Secondary | ICD-10-CM | POA: Diagnosis not present

## 2016-09-30 ENCOUNTER — Other Ambulatory Visit (HOSPITAL_BASED_OUTPATIENT_CLINIC_OR_DEPARTMENT_OTHER): Payer: Medicare Other

## 2016-09-30 ENCOUNTER — Encounter: Payer: Self-pay | Admitting: Oncology

## 2016-09-30 ENCOUNTER — Ambulatory Visit (HOSPITAL_COMMUNITY)
Admission: RE | Admit: 2016-09-30 | Discharge: 2016-09-30 | Disposition: A | Discharge status: Home / Self Care | Payer: Medicare Other | Source: Ambulatory Visit | Attending: Oncology | Admitting: Oncology

## 2016-09-30 ENCOUNTER — Ambulatory Visit (HOSPITAL_BASED_OUTPATIENT_CLINIC_OR_DEPARTMENT_OTHER): Payer: Medicare Other | Admitting: Oncology

## 2016-09-30 VITALS — BP 120/58 | HR 64 | Temp 98.4°F | Resp 18 | Ht 64.0 in | Wt 132.7 lb

## 2016-09-30 DIAGNOSIS — M8580 Other specified disorders of bone density and structure, unspecified site: Secondary | ICD-10-CM | POA: Insufficient documentation

## 2016-09-30 DIAGNOSIS — S199XXA Unspecified injury of neck, initial encounter: Secondary | ICD-10-CM | POA: Diagnosis not present

## 2016-09-30 DIAGNOSIS — C50211 Malignant neoplasm of upper-inner quadrant of right female breast: Secondary | ICD-10-CM

## 2016-09-30 DIAGNOSIS — Z79810 Long term (current) use of selective estrogen receptor modulators (SERMs): Secondary | ICD-10-CM

## 2016-09-30 DIAGNOSIS — M47812 Spondylosis without myelopathy or radiculopathy, cervical region: Secondary | ICD-10-CM | POA: Diagnosis not present

## 2016-09-30 DIAGNOSIS — Z170 Estrogen receptor positive status [ER+]: Secondary | ICD-10-CM | POA: Insufficient documentation

## 2016-09-30 LAB — COMPREHENSIVE METABOLIC PANEL
ALBUMIN: 3.7 g/dL (ref 3.5–5.0)
ALT: 25 U/L (ref 0–55)
AST: 30 U/L (ref 5–34)
Alkaline Phosphatase: 58 U/L (ref 40–150)
Anion Gap: 9 mEq/L (ref 3–11)
BUN: 18.1 mg/dL (ref 7.0–26.0)
CALCIUM: 10.6 mg/dL — AB (ref 8.4–10.4)
CHLORIDE: 106 meq/L (ref 98–109)
CO2: 27 mEq/L (ref 22–29)
CREATININE: 1 mg/dL (ref 0.6–1.1)
EGFR: 58 mL/min/{1.73_m2} — ABNORMAL LOW (ref >90)
GLUCOSE: 105 mg/dL (ref 70–140)
POTASSIUM: 4.4 meq/L (ref 3.5–5.1)
SODIUM: 141 meq/L (ref 136–145)
Total Bilirubin: 0.32 mg/dL (ref 0.20–1.20)
Total Protein: 6.7 g/dL (ref 6.4–8.3)

## 2016-09-30 LAB — CBC WITH DIFFERENTIAL/PLATELET
BASO%: 1.3 % (ref 0.0–2.0)
Basophils Absolute: 0.1 10*3/uL (ref 0.0–0.1)
EOS ABS: 0.1 10*3/uL (ref 0.0–0.5)
EOS%: 2.3 % (ref 0.0–7.0)
HCT: 43.2 % (ref 34.8–46.6)
HGB: 14.1 g/dL (ref 11.6–15.9)
LYMPH%: 23.9 % (ref 14.0–49.7)
MCH: 28.5 pg (ref 25.1–34.0)
MCHC: 32.6 g/dL (ref 31.5–36.0)
MCV: 87.3 fL (ref 79.5–101.0)
MONO#: 0.5 10*3/uL (ref 0.1–0.9)
MONO%: 8.9 % (ref 0.0–14.0)
NEUT#: 3.6 10*3/uL (ref 1.5–6.5)
NEUT%: 63.6 % (ref 38.4–76.8)
PLATELETS: 193 10*3/uL (ref 145–400)
RBC: 4.95 10*6/uL (ref 3.70–5.45)
RDW: 13.2 % (ref 11.2–14.5)
WBC: 5.6 10*3/uL (ref 3.9–10.3)
lymph#: 1.3 10*3/uL (ref 0.9–3.3)
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
## 2016-11-07 DIAGNOSIS — E039 Hypothyroidism, unspecified: Secondary | ICD-10-CM | POA: Diagnosis not present

## 2016-11-07 DIAGNOSIS — Z0000 Encounter for general adult medical examination without abnormal findings: Secondary | ICD-10-CM | POA: Diagnosis not present

## 2016-11-07 DIAGNOSIS — E119 Type 2 diabetes mellitus without complications: Secondary | ICD-10-CM | POA: Diagnosis not present

## 2016-11-14 DIAGNOSIS — E119 Type 2 diabetes mellitus without complications: Secondary | ICD-10-CM | POA: Diagnosis not present

## 2016-11-14 DIAGNOSIS — E039 Hypothyroidism, unspecified: Secondary | ICD-10-CM | POA: Diagnosis not present

## 2016-11-14 DIAGNOSIS — R232 Flushing: Secondary | ICD-10-CM | POA: Diagnosis not present

## 2016-11-14 DIAGNOSIS — N183 Chronic kidney disease, stage 3 (moderate): Secondary | ICD-10-CM | POA: Diagnosis not present

## 2016-11-24 ENCOUNTER — Telehealth: Payer: Self-pay

## 2016-11-24 NOTE — Telephone Encounter (Signed)
Pt called reporting that her hot flashes have not improved since stopping Tamoxifen.  Pt would like to resume Tamoxifen and try taking Black Cohosh.  Dr Jana Hakim has given his ok for pt to try this.

## 2016-12-02 ENCOUNTER — Telehealth: Payer: Self-pay

## 2016-12-02 MED ORDER — TAMOXIFEN CITRATE 20 MG PO TABS: 20.0000 mg | ORAL_TABLET | Freq: Every day | ORAL | 1 refills | Status: DC

## 2016-12-02 NOTE — Telephone Encounter (Signed)
Voicemail retrieved from patient.  Returned call confirming "symptoms did not improve while off Tamoxifen.  Would like a 90-day supply of Tamoxifen 20 mg sent to [facility].  I'll take Cohosh but want to resume Tamoxifen 20 mg as I was on originally."  Will send order.  Next F/U scheduled on 05-20-2017.

## 2016-12-02 NOTE — Telephone Encounter (Signed)
lvm to call back and inform us if her symptoms have improved we will switch to anastrozole. If her symptoms are about the same we will continue tamoxifen at 20 mg daily with 90 day supply.

## 2016-12-29 DIAGNOSIS — E039 Hypothyroidism, unspecified: Secondary | ICD-10-CM | POA: Diagnosis not present

## 2016-12-29 DIAGNOSIS — M792 Neuralgia and neuritis, unspecified: Secondary | ICD-10-CM | POA: Diagnosis not present

## 2017-01-28 DIAGNOSIS — Z23 Encounter for immunization: Secondary | ICD-10-CM | POA: Diagnosis not present

## 2017-02-02 DIAGNOSIS — M542 Cervicalgia: Secondary | ICD-10-CM | POA: Diagnosis not present

## 2017-02-02 DIAGNOSIS — M47812 Spondylosis without myelopathy or radiculopathy, cervical region: Secondary | ICD-10-CM | POA: Diagnosis not present

## 2017-02-02 DIAGNOSIS — M5412 Radiculopathy, cervical region: Secondary | ICD-10-CM | POA: Diagnosis not present

## 2017-02-10 DIAGNOSIS — T7840XA Allergy, unspecified, initial encounter: Secondary | ICD-10-CM | POA: Diagnosis not present

## 2017-02-11 DIAGNOSIS — M5126 Other intervertebral disc displacement, lumbar region: Secondary | ICD-10-CM | POA: Diagnosis not present

## 2017-02-11 DIAGNOSIS — E039 Hypothyroidism, unspecified: Secondary | ICD-10-CM | POA: Diagnosis not present

## 2017-02-11 DIAGNOSIS — M792 Neuralgia and neuritis, unspecified: Secondary | ICD-10-CM | POA: Diagnosis not present

## 2017-02-11 DIAGNOSIS — M5416 Radiculopathy, lumbar region: Secondary | ICD-10-CM | POA: Diagnosis not present

## 2017-02-11 DIAGNOSIS — M47816 Spondylosis without myelopathy or radiculopathy, lumbar region: Secondary | ICD-10-CM | POA: Diagnosis not present

## 2017-02-11 DIAGNOSIS — M5412 Radiculopathy, cervical region: Secondary | ICD-10-CM | POA: Diagnosis not present

## 2017-02-11 DIAGNOSIS — M542 Cervicalgia: Secondary | ICD-10-CM | POA: Diagnosis not present

## 2017-02-11 DIAGNOSIS — M545 Low back pain: Secondary | ICD-10-CM | POA: Diagnosis not present

## 2017-03-26 DIAGNOSIS — M8589 Other specified disorders of bone density and structure, multiple sites: Secondary | ICD-10-CM | POA: Diagnosis not present

## 2017-03-26 DIAGNOSIS — R922 Inconclusive mammogram: Secondary | ICD-10-CM | POA: Diagnosis not present

## 2017-03-26 DIAGNOSIS — Z853 Personal history of malignant neoplasm of breast: Secondary | ICD-10-CM | POA: Diagnosis not present

## 2017-05-15 ENCOUNTER — Other Ambulatory Visit: Payer: Self-pay

## 2017-05-15 DIAGNOSIS — C50211 Malignant neoplasm of upper-inner quadrant of right female breast: Secondary | ICD-10-CM

## 2017-05-15 DIAGNOSIS — Z170 Estrogen receptor positive status [ER+]: Principal | ICD-10-CM

## 2017-05-18 ENCOUNTER — Inpatient Hospital Stay: Payer: Medicare Other | Attending: Oncology

## 2017-05-18 DIAGNOSIS — G4730 Sleep apnea, unspecified: Secondary | ICD-10-CM | POA: Diagnosis not present

## 2017-05-18 DIAGNOSIS — Z87891 Personal history of nicotine dependence: Secondary | ICD-10-CM | POA: Diagnosis not present

## 2017-05-18 DIAGNOSIS — E039 Hypothyroidism, unspecified: Secondary | ICD-10-CM | POA: Diagnosis not present

## 2017-05-18 DIAGNOSIS — M8580 Other specified disorders of bone density and structure, unspecified site: Secondary | ICD-10-CM | POA: Diagnosis not present

## 2017-05-18 DIAGNOSIS — Z923 Personal history of irradiation: Secondary | ICD-10-CM | POA: Diagnosis not present

## 2017-05-18 DIAGNOSIS — Z90710 Acquired absence of both cervix and uterus: Secondary | ICD-10-CM | POA: Diagnosis not present

## 2017-05-18 DIAGNOSIS — Z79899 Other long term (current) drug therapy: Secondary | ICD-10-CM | POA: Insufficient documentation

## 2017-05-18 DIAGNOSIS — Z79810 Long term (current) use of selective estrogen receptor modulators (SERMs): Secondary | ICD-10-CM | POA: Diagnosis not present

## 2017-05-18 DIAGNOSIS — Z170 Estrogen receptor positive status [ER+]: Secondary | ICD-10-CM | POA: Diagnosis not present

## 2017-05-18 DIAGNOSIS — K219 Gastro-esophageal reflux disease without esophagitis: Secondary | ICD-10-CM | POA: Diagnosis not present

## 2017-05-18 DIAGNOSIS — C50211 Malignant neoplasm of upper-inner quadrant of right female breast: Secondary | ICD-10-CM | POA: Diagnosis not present

## 2017-05-18 LAB — CBC WITH DIFFERENTIAL (CANCER CENTER ONLY)
Basophils Absolute: 0 10*3/uL (ref 0.0–0.1)
Basophils Relative: 0 %
EOS ABS: 0.2 10*3/uL (ref 0.0–0.5)
EOS PCT: 5 %
HCT: 43 % (ref 34.8–46.6)
Hemoglobin: 14.1 g/dL (ref 11.6–15.9)
LYMPHS ABS: 1 10*3/uL (ref 0.9–3.3)
LYMPHS PCT: 27 %
MCH: 29.3 pg (ref 25.1–34.0)
MCHC: 32.8 g/dL (ref 31.5–36.0)
MCV: 89.4 fL (ref 79.5–101.0)
MONO ABS: 0.4 10*3/uL (ref 0.1–0.9)
MONOS PCT: 11 %
Neutro Abs: 2 10*3/uL (ref 1.5–6.5)
Neutrophils Relative %: 57 %
PLATELETS: 172 10*3/uL (ref 145–400)
RBC: 4.81 MIL/uL (ref 3.70–5.45)
RDW: 12.8 % (ref 11.2–14.5)
WBC: 3.5 10*3/uL — AB (ref 3.9–10.3)

## 2017-05-18 LAB — CMP (CANCER CENTER ONLY)
ALT: 28 U/L (ref 0–55)
AST: 34 U/L (ref 5–34)
Albumin: 3.4 g/dL — ABNORMAL LOW (ref 3.5–5.0)
Alkaline Phosphatase: 50 U/L (ref 40–150)
Anion gap: 9 (ref 3–11)
BUN: 10 mg/dL (ref 7–26)
CHLORIDE: 106 mmol/L (ref 98–109)
CO2: 24 mmol/L (ref 22–29)
Calcium: 9.3 mg/dL (ref 8.4–10.4)
Creatinine: 0.97 mg/dL (ref 0.60–1.10)
GFR, EST NON AFRICAN AMERICAN: 59 mL/min — AB (ref >60)
GFR, Est AFR Am: >60 mL/min (ref >60)
GLUCOSE: 135 mg/dL (ref 70–140)
POTASSIUM: 4.1 mmol/L (ref 3.5–5.1)
Sodium: 139 mmol/L (ref 136–145)
Total Bilirubin: 0.3 mg/dL (ref 0.2–1.2)
Total Protein: 6.2 g/dL — ABNORMAL LOW (ref 6.4–8.3)
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
## 2017-05-20 ENCOUNTER — Inpatient Hospital Stay (HOSPITAL_BASED_OUTPATIENT_CLINIC_OR_DEPARTMENT_OTHER): Payer: Medicare Other | Admitting: Oncology

## 2017-05-20 VITALS — BP 121/54 | HR 68 | Temp 98.0°F | Resp 18 | Ht 64.0 in | Wt 137.7 lb

## 2017-05-20 DIAGNOSIS — Z79810 Long term (current) use of selective estrogen receptor modulators (SERMs): Secondary | ICD-10-CM

## 2017-05-20 DIAGNOSIS — M8580 Other specified disorders of bone density and structure, unspecified site: Secondary | ICD-10-CM

## 2017-05-20 DIAGNOSIS — Z170 Estrogen receptor positive status [ER+]: Secondary | ICD-10-CM

## 2017-05-20 DIAGNOSIS — C50211 Malignant neoplasm of upper-inner quadrant of right female breast: Secondary | ICD-10-CM | POA: Diagnosis not present

## 2017-05-20 DIAGNOSIS — E039 Hypothyroidism, unspecified: Secondary | ICD-10-CM | POA: Diagnosis not present

## 2017-05-20 DIAGNOSIS — K219 Gastro-esophageal reflux disease without esophagitis: Secondary | ICD-10-CM | POA: Diagnosis not present

## 2017-05-22 DIAGNOSIS — E119 Type 2 diabetes mellitus without complications: Secondary | ICD-10-CM | POA: Diagnosis not present

## 2017-05-22 DIAGNOSIS — N183 Chronic kidney disease, stage 3 (moderate): Secondary | ICD-10-CM | POA: Diagnosis not present

## 2017-05-22 DIAGNOSIS — E039 Hypothyroidism, unspecified: Secondary | ICD-10-CM | POA: Diagnosis not present

## 2017-05-29 DIAGNOSIS — N183 Chronic kidney disease, stage 3 (moderate): Secondary | ICD-10-CM | POA: Diagnosis not present

## 2017-05-29 DIAGNOSIS — E039 Hypothyroidism, unspecified: Secondary | ICD-10-CM | POA: Diagnosis not present

## 2017-05-29 DIAGNOSIS — E119 Type 2 diabetes mellitus without complications: Secondary | ICD-10-CM | POA: Diagnosis not present

## 2017-05-29 DIAGNOSIS — E782 Mixed hyperlipidemia: Secondary | ICD-10-CM | POA: Diagnosis not present

## 2017-05-29 DIAGNOSIS — K219 Gastro-esophageal reflux disease without esophagitis: Secondary | ICD-10-CM | POA: Diagnosis not present

## 2017-06-07 ENCOUNTER — Other Ambulatory Visit: Payer: Self-pay | Admitting: Oncology

## 2017-06-24 DIAGNOSIS — E039 Hypothyroidism, unspecified: Secondary | ICD-10-CM | POA: Diagnosis not present

## 2017-06-24 DIAGNOSIS — E119 Type 2 diabetes mellitus without complications: Secondary | ICD-10-CM | POA: Diagnosis not present

## 2017-07-07 DIAGNOSIS — H2000 Unspecified acute and subacute iridocyclitis: Secondary | ICD-10-CM | POA: Diagnosis not present

## 2017-07-14 DIAGNOSIS — H2000 Unspecified acute and subacute iridocyclitis: Secondary | ICD-10-CM | POA: Diagnosis not present

## 2017-07-16 DIAGNOSIS — L821 Other seborrheic keratosis: Secondary | ICD-10-CM | POA: Diagnosis not present

## 2017-07-16 DIAGNOSIS — L820 Inflamed seborrheic keratosis: Secondary | ICD-10-CM | POA: Diagnosis not present

## 2017-07-16 DIAGNOSIS — L814 Other melanin hyperpigmentation: Secondary | ICD-10-CM | POA: Diagnosis not present

## 2017-07-16 DIAGNOSIS — I788 Other diseases of capillaries: Secondary | ICD-10-CM | POA: Diagnosis not present

## 2017-07-16 DIAGNOSIS — D1801 Hemangioma of skin and subcutaneous tissue: Secondary | ICD-10-CM | POA: Diagnosis not present

## 2017-07-16 DIAGNOSIS — L239 Allergic contact dermatitis, unspecified cause: Secondary | ICD-10-CM | POA: Diagnosis not present

## 2017-07-16 DIAGNOSIS — D225 Melanocytic nevi of trunk: Secondary | ICD-10-CM | POA: Diagnosis not present

## 2017-09-16 DIAGNOSIS — H2000 Unspecified acute and subacute iridocyclitis: Secondary | ICD-10-CM | POA: Diagnosis not present

## 2017-09-16 DIAGNOSIS — H2513 Age-related nuclear cataract, bilateral: Secondary | ICD-10-CM | POA: Diagnosis not present

## 2017-09-29 DIAGNOSIS — H2000 Unspecified acute and subacute iridocyclitis: Secondary | ICD-10-CM | POA: Diagnosis not present

## 2017-11-09 DIAGNOSIS — E039 Hypothyroidism, unspecified: Secondary | ICD-10-CM | POA: Diagnosis not present

## 2017-11-09 DIAGNOSIS — N183 Chronic kidney disease, stage 3 (moderate): Secondary | ICD-10-CM | POA: Diagnosis not present

## 2017-11-09 DIAGNOSIS — E782 Mixed hyperlipidemia: Secondary | ICD-10-CM | POA: Diagnosis not present

## 2017-11-09 DIAGNOSIS — E119 Type 2 diabetes mellitus without complications: Secondary | ICD-10-CM | POA: Diagnosis not present

## 2017-11-09 DIAGNOSIS — Z0000 Encounter for general adult medical examination without abnormal findings: Secondary | ICD-10-CM | POA: Diagnosis not present

## 2017-11-09 DIAGNOSIS — K219 Gastro-esophageal reflux disease without esophagitis: Secondary | ICD-10-CM | POA: Diagnosis not present

## 2017-11-16 DIAGNOSIS — E039 Hypothyroidism, unspecified: Secondary | ICD-10-CM | POA: Diagnosis not present

## 2017-11-16 DIAGNOSIS — R252 Cramp and spasm: Secondary | ICD-10-CM | POA: Diagnosis not present

## 2017-11-16 DIAGNOSIS — E119 Type 2 diabetes mellitus without complications: Secondary | ICD-10-CM | POA: Diagnosis not present

## 2017-11-16 DIAGNOSIS — N183 Chronic kidney disease, stage 3 (moderate): Secondary | ICD-10-CM | POA: Diagnosis not present

## 2017-11-16 DIAGNOSIS — G609 Hereditary and idiopathic neuropathy, unspecified: Secondary | ICD-10-CM | POA: Diagnosis not present

## 2017-11-16 DIAGNOSIS — K219 Gastro-esophageal reflux disease without esophagitis: Secondary | ICD-10-CM | POA: Diagnosis not present

## 2017-11-28 ENCOUNTER — Other Ambulatory Visit: Payer: Self-pay | Admitting: Oncology

## 2018-01-19 DIAGNOSIS — Z23 Encounter for immunization: Secondary | ICD-10-CM | POA: Diagnosis not present

## 2018-02-17 DIAGNOSIS — H04123 Dry eye syndrome of bilateral lacrimal glands: Secondary | ICD-10-CM | POA: Diagnosis not present

## 2018-02-17 DIAGNOSIS — H2513 Age-related nuclear cataract, bilateral: Secondary | ICD-10-CM | POA: Diagnosis not present

## 2018-03-15 DIAGNOSIS — R0989 Other specified symptoms and signs involving the circulatory and respiratory systems: Secondary | ICD-10-CM | POA: Diagnosis not present

## 2018-03-15 DIAGNOSIS — G603 Idiopathic progressive neuropathy: Secondary | ICD-10-CM | POA: Diagnosis not present

## 2018-03-17 ENCOUNTER — Other Ambulatory Visit: Payer: Self-pay | Admitting: Internal Medicine

## 2018-03-17 DIAGNOSIS — R0989 Other specified symptoms and signs involving the circulatory and respiratory systems: Secondary | ICD-10-CM

## 2018-04-15 DIAGNOSIS — I739 Peripheral vascular disease, unspecified: Secondary | ICD-10-CM | POA: Diagnosis not present

## 2018-04-15 DIAGNOSIS — R0989 Other specified symptoms and signs involving the circulatory and respiratory systems: Secondary | ICD-10-CM | POA: Diagnosis not present

## 2018-04-15 DIAGNOSIS — Z853 Personal history of malignant neoplasm of breast: Secondary | ICD-10-CM | POA: Diagnosis not present

## 2018-04-27 DIAGNOSIS — Z124 Encounter for screening for malignant neoplasm of cervix: Secondary | ICD-10-CM | POA: Diagnosis not present

## 2018-04-27 DIAGNOSIS — Z01419 Encounter for gynecological examination (general) (routine) without abnormal findings: Secondary | ICD-10-CM | POA: Diagnosis not present

## 2018-05-20 ENCOUNTER — Inpatient Hospital Stay: Payer: Medicare Other | Admitting: Oncology

## 2018-05-20 ENCOUNTER — Inpatient Hospital Stay: Payer: Medicare Other

## 2018-06-01 ENCOUNTER — Other Ambulatory Visit: Payer: Self-pay | Admitting: Oncology

## 2018-06-04 DIAGNOSIS — R6889 Other general symptoms and signs: Secondary | ICD-10-CM | POA: Diagnosis not present

## 2018-06-07 DIAGNOSIS — E782 Mixed hyperlipidemia: Secondary | ICD-10-CM | POA: Diagnosis not present

## 2018-06-07 DIAGNOSIS — E119 Type 2 diabetes mellitus without complications: Secondary | ICD-10-CM | POA: Diagnosis not present

## 2018-06-14 DIAGNOSIS — E039 Hypothyroidism, unspecified: Secondary | ICD-10-CM | POA: Diagnosis not present

## 2018-06-14 DIAGNOSIS — N183 Chronic kidney disease, stage 3 (moderate): Secondary | ICD-10-CM | POA: Diagnosis not present

## 2018-06-14 DIAGNOSIS — E119 Type 2 diabetes mellitus without complications: Secondary | ICD-10-CM | POA: Diagnosis not present

## 2018-06-14 DIAGNOSIS — G609 Hereditary and idiopathic neuropathy, unspecified: Secondary | ICD-10-CM | POA: Diagnosis not present

## 2018-06-14 DIAGNOSIS — K219 Gastro-esophageal reflux disease without esophagitis: Secondary | ICD-10-CM | POA: Diagnosis not present
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
## 2018-06-23 ENCOUNTER — Other Ambulatory Visit: Payer: Self-pay

## 2018-06-23 ENCOUNTER — Inpatient Hospital Stay (HOSPITAL_BASED_OUTPATIENT_CLINIC_OR_DEPARTMENT_OTHER): Payer: Medicare Other | Admitting: Oncology

## 2018-06-23 ENCOUNTER — Inpatient Hospital Stay: Payer: Medicare Other | Attending: Oncology

## 2018-06-23 VITALS — BP 99/51 | HR 69 | Temp 98.3°F | Resp 18 | Ht 64.0 in | Wt 134.4 lb

## 2018-06-23 DIAGNOSIS — R232 Flushing: Secondary | ICD-10-CM

## 2018-06-23 DIAGNOSIS — Z923 Personal history of irradiation: Secondary | ICD-10-CM

## 2018-06-23 DIAGNOSIS — E039 Hypothyroidism, unspecified: Secondary | ICD-10-CM | POA: Insufficient documentation

## 2018-06-23 DIAGNOSIS — Z79899 Other long term (current) drug therapy: Secondary | ICD-10-CM | POA: Insufficient documentation

## 2018-06-23 DIAGNOSIS — Z87891 Personal history of nicotine dependence: Secondary | ICD-10-CM

## 2018-06-23 DIAGNOSIS — Z79810 Long term (current) use of selective estrogen receptor modulators (SERMs): Secondary | ICD-10-CM

## 2018-06-23 DIAGNOSIS — Z170 Estrogen receptor positive status [ER+]: Secondary | ICD-10-CM

## 2018-06-23 DIAGNOSIS — C50211 Malignant neoplasm of upper-inner quadrant of right female breast: Secondary | ICD-10-CM

## 2018-06-23 DIAGNOSIS — R6883 Chills (without fever): Secondary | ICD-10-CM | POA: Diagnosis not present

## 2018-06-23 DIAGNOSIS — Z90710 Acquired absence of both cervix and uterus: Secondary | ICD-10-CM

## 2018-06-23 LAB — COMPREHENSIVE METABOLIC PANEL
ALT: 21 U/L (ref 0–44)
AST: 23 U/L (ref 15–41)
Albumin: 3.5 g/dL (ref 3.5–5.0)
Alkaline Phosphatase: 56 U/L (ref 38–126)
Anion gap: 9 (ref 5–15)
BILIRUBIN TOTAL: 0.3 mg/dL (ref 0.3–1.2)
BUN: 19 mg/dL (ref 8–23)
CO2: 23 mmol/L (ref 22–32)
Calcium: 9.7 mg/dL (ref 8.9–10.3)
Chloride: 108 mmol/L (ref 98–111)
Creatinine, Ser: 1.02 mg/dL — ABNORMAL HIGH (ref 0.44–1.00)
GFR calc Af Amer: >60 mL/min (ref >60)
GFR, EST NON AFRICAN AMERICAN: 56 mL/min — AB (ref >60)
Glucose, Bld: 140 mg/dL — ABNORMAL HIGH (ref 70–99)
Potassium: 4.1 mmol/L (ref 3.5–5.1)
Sodium: 140 mmol/L (ref 135–145)
TOTAL PROTEIN: 6.2 g/dL — AB (ref 6.5–8.1)

## 2018-06-23 LAB — CBC WITH DIFFERENTIAL/PLATELET
Abs Immature Granulocytes: 0 10*3/uL (ref 0.00–0.07)
Basophils Absolute: 0 10*3/uL (ref 0.0–0.1)
Basophils Relative: 1 %
EOS PCT: 4 %
Eosinophils Absolute: 0.2 10*3/uL (ref 0.0–0.5)
HCT: 39.6 % (ref 36.0–46.0)
Hemoglobin: 12.9 g/dL (ref 12.0–15.0)
Immature Granulocytes: 0 %
Lymphocytes Relative: 31 %
Lymphs Abs: 1.6 10*3/uL (ref 0.7–4.0)
MCH: 29.2 pg (ref 26.0–34.0)
MCHC: 32.6 g/dL (ref 30.0–36.0)
MCV: 89.6 fL (ref 80.0–100.0)
MONO ABS: 0.5 10*3/uL (ref 0.1–1.0)
MONOS PCT: 11 %
Neutro Abs: 2.7 10*3/uL (ref 1.7–7.7)
Neutrophils Relative %: 53 %
Platelets: 209 10*3/uL (ref 150–400)
RBC: 4.42 MIL/uL (ref 3.87–5.11)
RDW: 12.7 % (ref 11.5–15.5)
WBC: 5.1 10*3/uL (ref 4.0–10.5)
nRBC: 0 % (ref 0.0–0.2)

## 2018-06-23 MED ORDER — TAMOXIFEN CITRATE 20 MG PO TABS: 20.0000 mg | ORAL_TABLET | Freq: Every day | ORAL | 4 refills | Status: DC

## 2018-08-12 DIAGNOSIS — Z1211 Encounter for screening for malignant neoplasm of colon: Secondary | ICD-10-CM | POA: Diagnosis not present

## 2018-08-12 DIAGNOSIS — Z86010 Personal history of colonic polyps: Secondary | ICD-10-CM | POA: Diagnosis not present

## 2018-08-12 DIAGNOSIS — K641 Second degree hemorrhoids: Secondary | ICD-10-CM | POA: Diagnosis not present

## 2018-08-17 DIAGNOSIS — H02889 Meibomian gland dysfunction of unspecified eye, unspecified eyelid: Secondary | ICD-10-CM | POA: Diagnosis not present

## 2018-08-17 DIAGNOSIS — H2513 Age-related nuclear cataract, bilateral: Secondary | ICD-10-CM | POA: Diagnosis not present

## 2018-09-02 DIAGNOSIS — L821 Other seborrheic keratosis: Secondary | ICD-10-CM | POA: Diagnosis not present

## 2018-09-02 DIAGNOSIS — D692 Other nonthrombocytopenic purpura: Secondary | ICD-10-CM | POA: Diagnosis not present

## 2018-09-02 DIAGNOSIS — L820 Inflamed seborrheic keratosis: Secondary | ICD-10-CM | POA: Diagnosis not present

## 2018-09-02 DIAGNOSIS — D1801 Hemangioma of skin and subcutaneous tissue: Secondary | ICD-10-CM | POA: Diagnosis not present

## 2018-09-02 DIAGNOSIS — L239 Allergic contact dermatitis, unspecified cause: Secondary | ICD-10-CM | POA: Diagnosis not present

## 2018-09-02 DIAGNOSIS — L814 Other melanin hyperpigmentation: Secondary | ICD-10-CM | POA: Diagnosis not present

## 2018-09-15 DIAGNOSIS — Z1211 Encounter for screening for malignant neoplasm of colon: Secondary | ICD-10-CM | POA: Diagnosis not present

## 2018-09-15 DIAGNOSIS — D122 Benign neoplasm of ascending colon: Secondary | ICD-10-CM | POA: Diagnosis not present

## 2018-09-15 DIAGNOSIS — Z86010 Personal history of colonic polyps: Secondary | ICD-10-CM | POA: Diagnosis not present

## 2018-09-15 DIAGNOSIS — K635 Polyp of colon: Secondary | ICD-10-CM | POA: Diagnosis not present

## 2018-10-29 DIAGNOSIS — E039 Hypothyroidism, unspecified: Secondary | ICD-10-CM | POA: Diagnosis not present

## 2018-10-29 DIAGNOSIS — N183 Chronic kidney disease, stage 3 (moderate): Secondary | ICD-10-CM | POA: Diagnosis not present

## 2018-10-29 DIAGNOSIS — E119 Type 2 diabetes mellitus without complications: Secondary | ICD-10-CM | POA: Diagnosis not present

## 2018-10-29 DIAGNOSIS — Z20828 Contact with and (suspected) exposure to other viral communicable diseases: Secondary | ICD-10-CM | POA: Diagnosis not present

## 2018-10-29 DIAGNOSIS — E782 Mixed hyperlipidemia: Secondary | ICD-10-CM | POA: Diagnosis not present

## 2018-11-22 DIAGNOSIS — E039 Hypothyroidism, unspecified: Secondary | ICD-10-CM | POA: Diagnosis not present

## 2018-11-22 DIAGNOSIS — E782 Mixed hyperlipidemia: Secondary | ICD-10-CM | POA: Diagnosis not present

## 2018-11-22 DIAGNOSIS — E119 Type 2 diabetes mellitus without complications: Secondary | ICD-10-CM | POA: Diagnosis not present

## 2018-11-29 DIAGNOSIS — M8589 Other specified disorders of bone density and structure, multiple sites: Secondary | ICD-10-CM | POA: Diagnosis not present

## 2018-11-29 DIAGNOSIS — G4700 Insomnia, unspecified: Secondary | ICD-10-CM | POA: Diagnosis not present

## 2018-11-29 DIAGNOSIS — R232 Flushing: Secondary | ICD-10-CM | POA: Diagnosis not present

## 2018-11-29 DIAGNOSIS — Z0000 Encounter for general adult medical examination without abnormal findings: Secondary | ICD-10-CM | POA: Diagnosis not present

## 2018-11-29 DIAGNOSIS — C50211 Malignant neoplasm of upper-inner quadrant of right female breast: Secondary | ICD-10-CM | POA: Diagnosis not present

## 2018-11-29 DIAGNOSIS — E119 Type 2 diabetes mellitus without complications: Secondary | ICD-10-CM | POA: Diagnosis not present

## 2018-11-29 DIAGNOSIS — K219 Gastro-esophageal reflux disease without esophagitis: Secondary | ICD-10-CM | POA: Diagnosis not present

## 2018-11-29 DIAGNOSIS — Z7189 Other specified counseling: Secondary | ICD-10-CM | POA: Diagnosis not present

## 2018-11-29 DIAGNOSIS — E782 Mixed hyperlipidemia: Secondary | ICD-10-CM | POA: Diagnosis not present

## 2018-11-29 DIAGNOSIS — Z780 Asymptomatic menopausal state: Secondary | ICD-10-CM | POA: Diagnosis not present

## 2018-11-29 DIAGNOSIS — G609 Hereditary and idiopathic neuropathy, unspecified: Secondary | ICD-10-CM | POA: Diagnosis not present

## 2018-11-29 DIAGNOSIS — N183 Chronic kidney disease, stage 3 (moderate): Secondary | ICD-10-CM | POA: Diagnosis not present

## 2018-11-29 DIAGNOSIS — E039 Hypothyroidism, unspecified: Secondary | ICD-10-CM | POA: Diagnosis not present

## 2018-12-29 DIAGNOSIS — M19072 Primary osteoarthritis, left ankle and foot: Secondary | ICD-10-CM | POA: Diagnosis not present

## 2018-12-29 DIAGNOSIS — M19071 Primary osteoarthritis, right ankle and foot: Secondary | ICD-10-CM | POA: Diagnosis not present

## 2018-12-29 DIAGNOSIS — G5790 Unspecified mononeuropathy of unspecified lower limb: Secondary | ICD-10-CM | POA: Diagnosis not present

## 2019-01-06 DIAGNOSIS — Z23 Encounter for immunization: Secondary | ICD-10-CM | POA: Diagnosis not present

## 2019-01-13 DIAGNOSIS — M79671 Pain in right foot: Secondary | ICD-10-CM | POA: Diagnosis not present

## 2019-01-13 DIAGNOSIS — G603 Idiopathic progressive neuropathy: Secondary | ICD-10-CM | POA: Diagnosis not present

## 2019-01-13 DIAGNOSIS — G609 Hereditary and idiopathic neuropathy, unspecified: Secondary | ICD-10-CM | POA: Diagnosis not present

## 2019-01-13 DIAGNOSIS — R202 Paresthesia of skin: Secondary | ICD-10-CM | POA: Diagnosis not present

## 2019-01-13 DIAGNOSIS — R252 Cramp and spasm: Secondary | ICD-10-CM | POA: Diagnosis not present

## 2019-01-13 DIAGNOSIS — M5417 Radiculopathy, lumbosacral region: Secondary | ICD-10-CM | POA: Diagnosis not present

## 2019-01-13 DIAGNOSIS — G5601 Carpal tunnel syndrome, right upper limb: Secondary | ICD-10-CM | POA: Diagnosis not present

## 2019-01-13 DIAGNOSIS — M5412 Radiculopathy, cervical region: Secondary | ICD-10-CM | POA: Diagnosis not present

## 2019-01-13 DIAGNOSIS — R634 Abnormal weight loss: Secondary | ICD-10-CM | POA: Diagnosis not present

## 2019-01-13 DIAGNOSIS — E559 Vitamin D deficiency, unspecified: Secondary | ICD-10-CM | POA: Diagnosis not present

## 2019-01-13 DIAGNOSIS — M79672 Pain in left foot: Secondary | ICD-10-CM | POA: Diagnosis not present

## 2019-01-27 DIAGNOSIS — R252 Cramp and spasm: Secondary | ICD-10-CM | POA: Diagnosis not present

## 2019-01-27 DIAGNOSIS — M545 Low back pain: Secondary | ICD-10-CM | POA: Diagnosis not present

## 2019-01-27 DIAGNOSIS — G5601 Carpal tunnel syndrome, right upper limb: Secondary | ICD-10-CM | POA: Diagnosis not present

## 2019-01-27 DIAGNOSIS — G603 Idiopathic progressive neuropathy: Secondary | ICD-10-CM | POA: Diagnosis not present

## 2019-02-09 DIAGNOSIS — H04123 Dry eye syndrome of bilateral lacrimal glands: Secondary | ICD-10-CM | POA: Diagnosis not present

## 2019-02-09 DIAGNOSIS — H2513 Age-related nuclear cataract, bilateral: Secondary | ICD-10-CM | POA: Diagnosis not present

## 2019-02-09 DIAGNOSIS — H02889 Meibomian gland dysfunction of unspecified eye, unspecified eyelid: Secondary | ICD-10-CM | POA: Diagnosis not present

## 2019-02-22 DIAGNOSIS — H04123 Dry eye syndrome of bilateral lacrimal glands: Secondary | ICD-10-CM | POA: Diagnosis not present

## 2019-03-10 DIAGNOSIS — G603 Idiopathic progressive neuropathy: Secondary | ICD-10-CM | POA: Diagnosis not present

## 2019-03-10 DIAGNOSIS — G5603 Carpal tunnel syndrome, bilateral upper limbs: Secondary | ICD-10-CM | POA: Diagnosis not present

## 2019-03-10 DIAGNOSIS — R252 Cramp and spasm: Secondary | ICD-10-CM | POA: Diagnosis not present

## 2019-03-10 DIAGNOSIS — M545 Low back pain: Secondary | ICD-10-CM | POA: Diagnosis not present

## 2019-04-14 DIAGNOSIS — H5213 Myopia, bilateral: Secondary | ICD-10-CM | POA: Diagnosis not present

## 2019-04-14 DIAGNOSIS — H02883 Meibomian gland dysfunction of right eye, unspecified eyelid: Secondary | ICD-10-CM | POA: Diagnosis not present

## 2019-04-14 DIAGNOSIS — H04123 Dry eye syndrome of bilateral lacrimal glands: Secondary | ICD-10-CM | POA: Diagnosis not present

## 2019-04-14 DIAGNOSIS — H2513 Age-related nuclear cataract, bilateral: Secondary | ICD-10-CM | POA: Diagnosis not present

## 2019-05-18 DIAGNOSIS — R922 Inconclusive mammogram: Secondary | ICD-10-CM | POA: Diagnosis not present

## 2019-06-09 DIAGNOSIS — Z124 Encounter for screening for malignant neoplasm of cervix: Secondary | ICD-10-CM | POA: Diagnosis not present

## 2019-06-22 DIAGNOSIS — E119 Type 2 diabetes mellitus without complications: Secondary | ICD-10-CM | POA: Diagnosis not present

## 2019-06-22 DIAGNOSIS — E039 Hypothyroidism, unspecified: Secondary | ICD-10-CM | POA: Diagnosis not present

## 2019-06-22 DIAGNOSIS — E782 Mixed hyperlipidemia: Secondary | ICD-10-CM | POA: Diagnosis not present

## 2019-06-22 DIAGNOSIS — N183 Chronic kidney disease, stage 3 unspecified: Secondary | ICD-10-CM | POA: Diagnosis not present

## 2019-06-22 DIAGNOSIS — Z7189 Other specified counseling: Secondary | ICD-10-CM | POA: Diagnosis not present

## 2019-06-22 DIAGNOSIS — Z0000 Encounter for general adult medical examination without abnormal findings: Secondary | ICD-10-CM | POA: Diagnosis not present
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
## 2019-06-27 ENCOUNTER — Other Ambulatory Visit: Payer: Self-pay | Admitting: *Deleted

## 2019-06-27 ENCOUNTER — Inpatient Hospital Stay: Payer: Medicare Other | Attending: Oncology | Admitting: Oncology

## 2019-06-27 ENCOUNTER — Other Ambulatory Visit: Payer: Self-pay

## 2019-06-27 ENCOUNTER — Inpatient Hospital Stay: Payer: Medicare Other

## 2019-06-27 VITALS — BP 146/53 | HR 65 | Temp 98.2°F | Resp 18 | Ht 64.0 in | Wt 133.3 lb

## 2019-06-27 DIAGNOSIS — C50211 Malignant neoplasm of upper-inner quadrant of right female breast: Secondary | ICD-10-CM

## 2019-06-27 DIAGNOSIS — Z170 Estrogen receptor positive status [ER+]: Secondary | ICD-10-CM

## 2019-06-27 DIAGNOSIS — E119 Type 2 diabetes mellitus without complications: Secondary | ICD-10-CM | POA: Insufficient documentation

## 2019-06-27 DIAGNOSIS — Z79810 Long term (current) use of selective estrogen receptor modulators (SERMs): Secondary | ICD-10-CM | POA: Insufficient documentation

## 2019-06-27 LAB — CBC WITH DIFFERENTIAL (CANCER CENTER ONLY)
Abs Immature Granulocytes: 0.01 10*3/uL (ref 0.00–0.07)
Basophils Absolute: 0 10*3/uL (ref 0.0–0.1)
Basophils Relative: 1 %
Eosinophils Absolute: 0.1 10*3/uL (ref 0.0–0.5)
Eosinophils Relative: 3 %
HCT: 40.8 % (ref 36.0–46.0)
Hemoglobin: 13.2 g/dL (ref 12.0–15.0)
Immature Granulocytes: 0 %
Lymphocytes Relative: 32 %
Lymphs Abs: 1.4 10*3/uL (ref 0.7–4.0)
MCH: 29.1 pg (ref 26.0–34.0)
MCHC: 32.4 g/dL (ref 30.0–36.0)
MCV: 89.9 fL (ref 80.0–100.0)
Monocytes Absolute: 0.4 10*3/uL (ref 0.1–1.0)
Monocytes Relative: 9 %
Neutro Abs: 2.4 10*3/uL (ref 1.7–7.7)
Neutrophils Relative %: 55 %
Platelet Count: 181 10*3/uL (ref 150–400)
RBC: 4.54 MIL/uL (ref 3.87–5.11)
RDW: 12.3 % (ref 11.5–15.5)
WBC Count: 4.3 10*3/uL (ref 4.0–10.5)
nRBC: 0 % (ref 0.0–0.2)

## 2019-06-27 LAB — CMP (CANCER CENTER ONLY)
ALT: 17 U/L (ref 0–44)
AST: 25 U/L (ref 15–41)
Albumin: 3.6 g/dL (ref 3.5–5.0)
Alkaline Phosphatase: 58 U/L (ref 38–126)
Anion gap: 8 (ref 5–15)
BUN: 13 mg/dL (ref 8–23)
CO2: 24 mmol/L (ref 22–32)
Calcium: 9.8 mg/dL (ref 8.9–10.3)
Chloride: 108 mmol/L (ref 98–111)
Creatinine: 1.03 mg/dL — ABNORMAL HIGH (ref 0.44–1.00)
GFR, Est AFR Am: >60 mL/min (ref >60)
GFR, Estimated: 55 mL/min — ABNORMAL LOW (ref >60)
Glucose, Bld: 228 mg/dL — ABNORMAL HIGH (ref 70–99)
Potassium: 4.4 mmol/L (ref 3.5–5.1)
Sodium: 140 mmol/L (ref 135–145)
Total Bilirubin: 0.3 mg/dL (ref 0.3–1.2)
Total Protein: 5.9 g/dL — ABNORMAL LOW (ref 6.5–8.1)

## 2019-06-29 ENCOUNTER — Telehealth: Payer: Self-pay | Admitting: Oncology

## 2019-06-29 DIAGNOSIS — E782 Mixed hyperlipidemia: Secondary | ICD-10-CM | POA: Diagnosis not present

## 2019-06-29 DIAGNOSIS — E119 Type 2 diabetes mellitus without complications: Secondary | ICD-10-CM | POA: Diagnosis not present

## 2019-06-29 DIAGNOSIS — N182 Chronic kidney disease, stage 2 (mild): Secondary | ICD-10-CM | POA: Diagnosis not present

## 2019-06-29 DIAGNOSIS — E039 Hypothyroidism, unspecified: Secondary | ICD-10-CM | POA: Diagnosis not present

## 2019-06-29 DIAGNOSIS — K219 Gastro-esophageal reflux disease without esophagitis: Secondary | ICD-10-CM | POA: Diagnosis not present

## 2019-06-29 NOTE — Telephone Encounter (Signed)
Scheduled appts per 06/27/2019 los. Pt confirmed appt date and times.

## 2019-08-31 ENCOUNTER — Other Ambulatory Visit: Payer: Self-pay | Admitting: Oncology

## 2019-10-25 DIAGNOSIS — D2239 Melanocytic nevi of other parts of face: Secondary | ICD-10-CM | POA: Diagnosis not present

## 2019-10-25 DIAGNOSIS — D1801 Hemangioma of skin and subcutaneous tissue: Secondary | ICD-10-CM | POA: Diagnosis not present

## 2019-10-25 DIAGNOSIS — L820 Inflamed seborrheic keratosis: Secondary | ICD-10-CM | POA: Diagnosis not present

## 2019-10-25 DIAGNOSIS — L821 Other seborrheic keratosis: Secondary | ICD-10-CM | POA: Diagnosis not present

## 2019-10-25 DIAGNOSIS — D225 Melanocytic nevi of trunk: Secondary | ICD-10-CM | POA: Diagnosis not present

## 2019-10-25 DIAGNOSIS — L813 Cafe au lait spots: Secondary | ICD-10-CM | POA: Diagnosis not present

## 2019-10-25 DIAGNOSIS — L814 Other melanin hyperpigmentation: Secondary | ICD-10-CM | POA: Diagnosis not present

## 2019-12-02 ENCOUNTER — Other Ambulatory Visit: Payer: Self-pay | Admitting: Oncology

## 2020-01-10 DIAGNOSIS — Z20828 Contact with and (suspected) exposure to other viral communicable diseases: Secondary | ICD-10-CM | POA: Diagnosis not present

## 2020-01-16 DIAGNOSIS — Z23 Encounter for immunization: Secondary | ICD-10-CM | POA: Diagnosis not present

## 2020-02-28 ENCOUNTER — Other Ambulatory Visit: Payer: Self-pay | Admitting: Oncology

## 2020-03-09 DIAGNOSIS — Z79899 Other long term (current) drug therapy: Secondary | ICD-10-CM | POA: Diagnosis not present

## 2020-03-09 DIAGNOSIS — G609 Hereditary and idiopathic neuropathy, unspecified: Secondary | ICD-10-CM | POA: Diagnosis not present

## 2020-03-09 DIAGNOSIS — Z0000 Encounter for general adult medical examination without abnormal findings: Secondary | ICD-10-CM | POA: Diagnosis not present

## 2020-03-09 DIAGNOSIS — M1990 Unspecified osteoarthritis, unspecified site: Secondary | ICD-10-CM | POA: Diagnosis not present

## 2020-03-09 DIAGNOSIS — E782 Mixed hyperlipidemia: Secondary | ICD-10-CM | POA: Diagnosis not present

## 2020-03-09 DIAGNOSIS — Z23 Encounter for immunization: Secondary | ICD-10-CM | POA: Diagnosis not present

## 2020-03-09 DIAGNOSIS — E119 Type 2 diabetes mellitus without complications: Secondary | ICD-10-CM | POA: Diagnosis not present

## 2020-03-09 DIAGNOSIS — E039 Hypothyroidism, unspecified: Secondary | ICD-10-CM | POA: Diagnosis not present

## 2020-03-09 DIAGNOSIS — N182 Chronic kidney disease, stage 2 (mild): Secondary | ICD-10-CM | POA: Diagnosis not present

## 2020-03-16 DIAGNOSIS — E039 Hypothyroidism, unspecified: Secondary | ICD-10-CM | POA: Diagnosis not present

## 2020-03-16 DIAGNOSIS — M791 Myalgia, unspecified site: Secondary | ICD-10-CM | POA: Diagnosis not present

## 2020-03-16 DIAGNOSIS — E782 Mixed hyperlipidemia: Secondary | ICD-10-CM | POA: Diagnosis not present

## 2020-03-16 DIAGNOSIS — N182 Chronic kidney disease, stage 2 (mild): Secondary | ICD-10-CM | POA: Diagnosis not present

## 2020-03-16 DIAGNOSIS — G603 Idiopathic progressive neuropathy: Secondary | ICD-10-CM | POA: Diagnosis not present

## 2020-03-16 DIAGNOSIS — R29898 Other symptoms and signs involving the musculoskeletal system: Secondary | ICD-10-CM | POA: Diagnosis not present

## 2020-03-16 DIAGNOSIS — E119 Type 2 diabetes mellitus without complications: Secondary | ICD-10-CM | POA: Diagnosis not present

## 2020-03-29 DIAGNOSIS — Z20822 Contact with and (suspected) exposure to covid-19: Secondary | ICD-10-CM | POA: Diagnosis not present

## 2020-04-19 DIAGNOSIS — H02889 Meibomian gland dysfunction of unspecified eye, unspecified eyelid: Secondary | ICD-10-CM | POA: Diagnosis not present

## 2020-04-19 DIAGNOSIS — H4921 Sixth [abducent] nerve palsy, right eye: Secondary | ICD-10-CM | POA: Diagnosis not present

## 2020-04-19 DIAGNOSIS — H2513 Age-related nuclear cataract, bilateral: Secondary | ICD-10-CM | POA: Diagnosis not present

## 2020-04-19 DIAGNOSIS — E119 Type 2 diabetes mellitus without complications: Secondary | ICD-10-CM | POA: Diagnosis not present

## 2020-04-19 DIAGNOSIS — H524 Presbyopia: Secondary | ICD-10-CM | POA: Diagnosis not present

## 2020-04-19 DIAGNOSIS — H5213 Myopia, bilateral: Secondary | ICD-10-CM | POA: Diagnosis not present

## 2020-05-25 ENCOUNTER — Other Ambulatory Visit: Payer: Self-pay | Admitting: Oncology

## 2020-05-28 ENCOUNTER — Other Ambulatory Visit: Payer: Self-pay | Admitting: *Deleted

## 2020-05-28 MED ORDER — TAMOXIFEN CITRATE 20 MG PO TABS: 20.0000 mg | ORAL_TABLET | Freq: Every day | ORAL | 0 refills | Status: DC

## 2020-07-03 DIAGNOSIS — M8589 Other specified disorders of bone density and structure, multiple sites: Secondary | ICD-10-CM | POA: Diagnosis not present

## 2020-07-03 DIAGNOSIS — Z853 Personal history of malignant neoplasm of breast: Secondary | ICD-10-CM | POA: Diagnosis not present

## 2020-07-05 ENCOUNTER — Other Ambulatory Visit: Payer: Self-pay

## 2020-07-05 DIAGNOSIS — C50211 Malignant neoplasm of upper-inner quadrant of right female breast: Secondary | ICD-10-CM

## 2020-07-05 DIAGNOSIS — Z170 Estrogen receptor positive status [ER+]: Secondary | ICD-10-CM
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
## 2020-07-09 ENCOUNTER — Inpatient Hospital Stay: Payer: Medicare HMO | Attending: Oncology | Admitting: Oncology

## 2020-07-09 ENCOUNTER — Inpatient Hospital Stay: Payer: Medicare HMO

## 2020-07-09 ENCOUNTER — Other Ambulatory Visit: Payer: Self-pay

## 2020-07-09 VITALS — BP 138/59 | HR 79 | Temp 97.1°F | Resp 18 | Ht 64.0 in | Wt 136.7 lb

## 2020-07-09 DIAGNOSIS — Z923 Personal history of irradiation: Secondary | ICD-10-CM | POA: Diagnosis not present

## 2020-07-09 DIAGNOSIS — Z170 Estrogen receptor positive status [ER+]: Secondary | ICD-10-CM

## 2020-07-09 DIAGNOSIS — Z9223 Personal history of estrogen therapy: Secondary | ICD-10-CM | POA: Insufficient documentation

## 2020-07-09 DIAGNOSIS — Z853 Personal history of malignant neoplasm of breast: Secondary | ICD-10-CM | POA: Insufficient documentation

## 2020-07-09 DIAGNOSIS — C50211 Malignant neoplasm of upper-inner quadrant of right female breast: Secondary | ICD-10-CM

## 2020-07-09 LAB — CMP (CANCER CENTER ONLY)
ALT: 21 U/L (ref 0–44)
AST: 31 U/L (ref 15–41)
Albumin: 3.7 g/dL (ref 3.5–5.0)
Alkaline Phosphatase: 55 U/L (ref 38–126)
Anion gap: 11 (ref 5–15)
BUN: 16 mg/dL (ref 8–23)
CO2: 24 mmol/L (ref 22–32)
Calcium: 9.7 mg/dL (ref 8.9–10.3)
Chloride: 106 mmol/L (ref 98–111)
Creatinine: 1.04 mg/dL — ABNORMAL HIGH (ref 0.44–1.00)
GFR, Estimated: 57 mL/min — ABNORMAL LOW (ref >60)
Glucose, Bld: 119 mg/dL — ABNORMAL HIGH (ref 70–99)
Potassium: 4.8 mmol/L (ref 3.5–5.1)
Sodium: 141 mmol/L (ref 135–145)
Total Bilirubin: 0.5 mg/dL (ref 0.3–1.2)
Total Protein: 6.4 g/dL — ABNORMAL LOW (ref 6.5–8.1)

## 2020-07-09 LAB — CBC WITH DIFFERENTIAL (CANCER CENTER ONLY)
Abs Immature Granulocytes: 0 10*3/uL (ref 0.00–0.07)
Basophils Absolute: 0 10*3/uL (ref 0.0–0.1)
Basophils Relative: 1 %
Eosinophils Absolute: 0.2 10*3/uL (ref 0.0–0.5)
Eosinophils Relative: 5 %
HCT: 40.7 % (ref 36.0–46.0)
Hemoglobin: 13.3 g/dL (ref 12.0–15.0)
Immature Granulocytes: 0 %
Lymphocytes Relative: 33 %
Lymphs Abs: 1.4 10*3/uL (ref 0.7–4.0)
MCH: 29.1 pg (ref 26.0–34.0)
MCHC: 32.7 g/dL (ref 30.0–36.0)
MCV: 89.1 fL (ref 80.0–100.0)
Monocytes Absolute: 0.5 10*3/uL (ref 0.1–1.0)
Monocytes Relative: 12 %
Neutro Abs: 2.2 10*3/uL (ref 1.7–7.7)
Neutrophils Relative %: 49 %
Platelet Count: 192 10*3/uL (ref 150–400)
RBC: 4.57 MIL/uL (ref 3.87–5.11)
RDW: 12.6 % (ref 11.5–15.5)
WBC Count: 4.4 10*3/uL (ref 4.0–10.5)
nRBC: 0 % (ref 0.0–0.2)

## 2020-07-20 ENCOUNTER — Telehealth: Payer: Self-pay

## 2020-07-20 NOTE — Telephone Encounter (Signed)
Pt called to report that she has stopped Tamoxifen as MD recommended on 4/4.   Pt reports since she has stopped taking Tamoxifen she has been feeling a "little different, a little dizzy."  Pt able to check BP and blood glucose.  Blood glucose 116 - BP WNL.  Pt denies any blurred vision or chest pain.   RN encouraged patient to re-start tamoxifen every other day X 1 week.  Pt will call to report if any changes during the week, and will call next Friday to update clinic on status.   RN encouraged if symptoms change to include blurred vision, headache, or chest pain to utilize ED.  Pt verbalized understanding and agreement.

## 2020-08-01 ENCOUNTER — Other Ambulatory Visit: Payer: Self-pay

## 2020-08-01 ENCOUNTER — Telehealth: Payer: Self-pay

## 2020-08-01 DIAGNOSIS — Z170 Estrogen receptor positive status [ER+]: Secondary | ICD-10-CM

## 2020-08-01 NOTE — Telephone Encounter (Signed)
Returned call to pt regarding stopping tamoxifen. Pt states she has been having "racing heart" issues and "feeling overwhelmed emotionally" since stopping tamoxifen. Pt had been taking EOD but stopped. Encouraged pt to try taking the tamoxifen every 3 days for a while and then every 4 days. Pt also stated she needed to see her endocrinologist to have her thyroid level checked, that may be causing her issues . Checked with MD and he stated stopping tamoxifen did not normally cause emotional lability . Encouraged pt to make an appointment with Dr Suzette Battiest. Pt agreed and verbalized understanding.

## 2020-08-02 DIAGNOSIS — E039 Hypothyroidism, unspecified: Secondary | ICD-10-CM | POA: Diagnosis not present

## 2020-08-22 DIAGNOSIS — Z20822 Contact with and (suspected) exposure to covid-19: Secondary | ICD-10-CM | POA: Diagnosis not present

## 2020-08-31 ENCOUNTER — Encounter: Payer: Self-pay | Admitting: Oncology

## 2020-09-13 DIAGNOSIS — E039 Hypothyroidism, unspecified: Secondary | ICD-10-CM | POA: Diagnosis not present

## 2020-10-18 DIAGNOSIS — E119 Type 2 diabetes mellitus without complications: Secondary | ICD-10-CM | POA: Diagnosis not present

## 2020-10-18 DIAGNOSIS — H04123 Dry eye syndrome of bilateral lacrimal glands: Secondary | ICD-10-CM | POA: Diagnosis not present

## 2020-10-18 DIAGNOSIS — H02889 Meibomian gland dysfunction of unspecified eye, unspecified eyelid: Secondary | ICD-10-CM | POA: Diagnosis not present

## 2020-11-06 DIAGNOSIS — E782 Mixed hyperlipidemia: Secondary | ICD-10-CM | POA: Diagnosis not present

## 2020-11-06 DIAGNOSIS — E119 Type 2 diabetes mellitus without complications: Secondary | ICD-10-CM | POA: Diagnosis not present

## 2020-11-13 DIAGNOSIS — E039 Hypothyroidism, unspecified: Secondary | ICD-10-CM | POA: Diagnosis not present

## 2020-11-13 DIAGNOSIS — N182 Chronic kidney disease, stage 2 (mild): Secondary | ICD-10-CM | POA: Diagnosis not present

## 2020-11-13 DIAGNOSIS — G609 Hereditary and idiopathic neuropathy, unspecified: Secondary | ICD-10-CM | POA: Diagnosis not present

## 2020-11-13 DIAGNOSIS — K219 Gastro-esophageal reflux disease without esophagitis: Secondary | ICD-10-CM | POA: Diagnosis not present

## 2020-11-13 DIAGNOSIS — E119 Type 2 diabetes mellitus without complications: Secondary | ICD-10-CM | POA: Diagnosis not present

## 2020-12-17 DIAGNOSIS — Z23 Encounter for immunization: Secondary | ICD-10-CM | POA: Diagnosis not present

## 2021-01-01 DIAGNOSIS — M65342 Trigger finger, left ring finger: Secondary | ICD-10-CM | POA: Diagnosis not present

## 2021-02-22 DIAGNOSIS — L821 Other seborrheic keratosis: Secondary | ICD-10-CM | POA: Diagnosis not present

## 2021-02-22 DIAGNOSIS — L820 Inflamed seborrheic keratosis: Secondary | ICD-10-CM | POA: Diagnosis not present

## 2021-02-22 DIAGNOSIS — D485 Neoplasm of uncertain behavior of skin: Secondary | ICD-10-CM | POA: Diagnosis not present

## 2021-02-22 DIAGNOSIS — L813 Cafe au lait spots: Secondary | ICD-10-CM | POA: Diagnosis not present

## 2021-02-22 DIAGNOSIS — L814 Other melanin hyperpigmentation: Secondary | ICD-10-CM | POA: Diagnosis not present

## 2021-02-22 DIAGNOSIS — D692 Other nonthrombocytopenic purpura: Secondary | ICD-10-CM | POA: Diagnosis not present

## 2021-03-19 DIAGNOSIS — E039 Hypothyroidism, unspecified: Secondary | ICD-10-CM | POA: Diagnosis not present

## 2021-03-19 DIAGNOSIS — K219 Gastro-esophageal reflux disease without esophagitis: Secondary | ICD-10-CM | POA: Diagnosis not present

## 2021-03-19 DIAGNOSIS — E119 Type 2 diabetes mellitus without complications: Secondary | ICD-10-CM | POA: Diagnosis not present

## 2021-03-19 DIAGNOSIS — N182 Chronic kidney disease, stage 2 (mild): Secondary | ICD-10-CM | POA: Diagnosis not present

## 2021-03-26 DIAGNOSIS — E782 Mixed hyperlipidemia: Secondary | ICD-10-CM | POA: Diagnosis not present

## 2021-03-26 DIAGNOSIS — N1831 Chronic kidney disease, stage 3a: Secondary | ICD-10-CM | POA: Diagnosis not present

## 2021-03-26 DIAGNOSIS — K219 Gastro-esophageal reflux disease without esophagitis: Secondary | ICD-10-CM | POA: Diagnosis not present

## 2021-03-26 DIAGNOSIS — Z0000 Encounter for general adult medical examination without abnormal findings: Secondary | ICD-10-CM | POA: Diagnosis not present

## 2021-03-26 DIAGNOSIS — E039 Hypothyroidism, unspecified: Secondary | ICD-10-CM | POA: Diagnosis not present

## 2021-03-26 DIAGNOSIS — G609 Hereditary and idiopathic neuropathy, unspecified: Secondary | ICD-10-CM | POA: Diagnosis not present

## 2021-03-26 DIAGNOSIS — E1122 Type 2 diabetes mellitus with diabetic chronic kidney disease: Secondary | ICD-10-CM | POA: Diagnosis not present

## 2021-03-26 DIAGNOSIS — G603 Idiopathic progressive neuropathy: Secondary | ICD-10-CM | POA: Diagnosis not present

## 2021-05-08 DIAGNOSIS — H524 Presbyopia: Secondary | ICD-10-CM | POA: Diagnosis not present

## 2021-05-13 DIAGNOSIS — Z853 Personal history of malignant neoplasm of breast: Secondary | ICD-10-CM | POA: Diagnosis not present

## 2021-05-13 DIAGNOSIS — N644 Mastodynia: Secondary | ICD-10-CM | POA: Diagnosis not present

## 2021-05-25 ENCOUNTER — Ambulatory Visit (HOSPITAL_COMMUNITY)
Admission: EM | Admit: 2021-05-25 | Discharge: 2021-05-25 | Disposition: A | Discharge status: Home / Self Care | Payer: Medicare HMO | Attending: Internal Medicine | Admitting: Internal Medicine

## 2021-05-25 ENCOUNTER — Ambulatory Visit (INDEPENDENT_AMBULATORY_CARE_PROVIDER_SITE_OTHER): Payer: Medicare HMO

## 2021-05-25 ENCOUNTER — Encounter (HOSPITAL_COMMUNITY): Payer: Self-pay | Admitting: Emergency Medicine

## 2021-05-25 DIAGNOSIS — S838X2A Sprain of other specified parts of left knee, initial encounter: Secondary | ICD-10-CM

## 2021-05-25 DIAGNOSIS — M25562 Pain in left knee: Secondary | ICD-10-CM | POA: Diagnosis not present

## 2021-05-25 NOTE — Discharge Instructions (Addendum)
Icing of the left knee Gentle range of motion exercises Take Tylenol as needed for pain Gentle stretches of the leg to help with muscle pain and stiffness X-ray of the left knee was negative for fracture Return to urgent care if symptoms worsen.

## 2021-05-25 NOTE — ED Triage Notes (Signed)
Pt reports that she fell 9 days ago with all her weight on left knee. Reports elevated and iced first day. Still having Pain in left leg and due to compensating for the pain causing pain in right hip.
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
## 2021-06-24 DIAGNOSIS — Z01419 Encounter for gynecological examination (general) (routine) without abnormal findings: Secondary | ICD-10-CM | POA: Diagnosis not present

## 2021-06-24 DIAGNOSIS — Z01411 Encounter for gynecological examination (general) (routine) with abnormal findings: Secondary | ICD-10-CM | POA: Diagnosis not present

## 2021-06-24 DIAGNOSIS — Z6824 Body mass index (BMI) 24.0-24.9, adult: Secondary | ICD-10-CM | POA: Diagnosis not present

## 2021-06-24 DIAGNOSIS — Z90711 Acquired absence of uterus with remaining cervical stump: Secondary | ICD-10-CM | POA: Diagnosis not present

## 2021-06-24 DIAGNOSIS — Z124 Encounter for screening for malignant neoplasm of cervix: Secondary | ICD-10-CM | POA: Diagnosis not present

## 2021-06-24 DIAGNOSIS — M8580 Other specified disorders of bone density and structure, unspecified site: Secondary | ICD-10-CM | POA: Diagnosis not present

## 2021-08-01 DIAGNOSIS — G4733 Obstructive sleep apnea (adult) (pediatric): Secondary | ICD-10-CM | POA: Diagnosis not present

## 2021-08-02 DIAGNOSIS — E039 Hypothyroidism, unspecified: Secondary | ICD-10-CM | POA: Diagnosis not present

## 2021-09-17 DIAGNOSIS — L820 Inflamed seborrheic keratosis: Secondary | ICD-10-CM | POA: Diagnosis not present

## 2021-09-24 DIAGNOSIS — N1831 Chronic kidney disease, stage 3a: Secondary | ICD-10-CM | POA: Diagnosis not present

## 2021-09-24 DIAGNOSIS — E1122 Type 2 diabetes mellitus with diabetic chronic kidney disease: Secondary | ICD-10-CM | POA: Diagnosis not present

## 2021-09-24 DIAGNOSIS — E782 Mixed hyperlipidemia: Secondary | ICD-10-CM | POA: Diagnosis not present

## 2021-09-24 DIAGNOSIS — E039 Hypothyroidism, unspecified: Secondary | ICD-10-CM | POA: Diagnosis not present

## 2021-09-24 DIAGNOSIS — G609 Hereditary and idiopathic neuropathy, unspecified: Secondary | ICD-10-CM | POA: Diagnosis not present

## 2021-09-30 DIAGNOSIS — G4733 Obstructive sleep apnea (adult) (pediatric): Secondary | ICD-10-CM | POA: Diagnosis not present

## 2021-10-01 DIAGNOSIS — E1122 Type 2 diabetes mellitus with diabetic chronic kidney disease: Secondary | ICD-10-CM | POA: Diagnosis not present

## 2021-10-01 DIAGNOSIS — G603 Idiopathic progressive neuropathy: Secondary | ICD-10-CM | POA: Diagnosis not present

## 2021-10-01 DIAGNOSIS — E782 Mixed hyperlipidemia: Secondary | ICD-10-CM | POA: Diagnosis not present

## 2021-10-01 DIAGNOSIS — G609 Hereditary and idiopathic neuropathy, unspecified: Secondary | ICD-10-CM | POA: Diagnosis not present

## 2021-10-01 DIAGNOSIS — K219 Gastro-esophageal reflux disease without esophagitis: Secondary | ICD-10-CM | POA: Diagnosis not present

## 2021-10-01 DIAGNOSIS — E039 Hypothyroidism, unspecified: Secondary | ICD-10-CM | POA: Diagnosis not present

## 2021-10-01 DIAGNOSIS — N1831 Chronic kidney disease, stage 3a: Secondary | ICD-10-CM | POA: Diagnosis not present

## 2021-10-16 DIAGNOSIS — K30 Functional dyspepsia: Secondary | ICD-10-CM | POA: Diagnosis not present

## 2021-10-16 DIAGNOSIS — R490 Dysphonia: Secondary | ICD-10-CM | POA: Diagnosis not present

## 2021-10-16 DIAGNOSIS — R053 Chronic cough: Secondary | ICD-10-CM | POA: Diagnosis not present

## 2021-11-18 DIAGNOSIS — R053 Chronic cough: Secondary | ICD-10-CM | POA: Diagnosis not present

## 2021-11-18 DIAGNOSIS — K30 Functional dyspepsia: Secondary | ICD-10-CM | POA: Diagnosis not present

## 2021-11-18 DIAGNOSIS — R490 Dysphonia: Secondary | ICD-10-CM | POA: Diagnosis not present

## 2022-01-09 DIAGNOSIS — M65342 Trigger finger, left ring finger: Secondary | ICD-10-CM | POA: Diagnosis not present

## 2022-01-17 DIAGNOSIS — Z23 Encounter for immunization: Secondary | ICD-10-CM | POA: Diagnosis not present

## 2022-02-06 DIAGNOSIS — M65342 Trigger finger, left ring finger: Secondary | ICD-10-CM | POA: Diagnosis not present

## 2022-03-03 DIAGNOSIS — L218 Other seborrheic dermatitis: Secondary | ICD-10-CM | POA: Diagnosis not present

## 2022-03-03 DIAGNOSIS — Z1283 Encounter for screening for malignant neoplasm of skin: Secondary | ICD-10-CM | POA: Diagnosis not present

## 2022-03-03 DIAGNOSIS — L908 Other atrophic disorders of skin: Secondary | ICD-10-CM | POA: Diagnosis not present

## 2022-03-03 DIAGNOSIS — D225 Melanocytic nevi of trunk: Secondary | ICD-10-CM | POA: Diagnosis not present

## 2022-03-06 ENCOUNTER — Encounter (HOSPITAL_BASED_OUTPATIENT_CLINIC_OR_DEPARTMENT_OTHER): Payer: Self-pay | Admitting: Pulmonary Disease

## 2022-03-06 ENCOUNTER — Ambulatory Visit (INDEPENDENT_AMBULATORY_CARE_PROVIDER_SITE_OTHER): Payer: Medicare HMO | Admitting: Pulmonary Disease

## 2022-03-06 VITALS — BP 122/68 | HR 55 | Temp 98.7°F | Ht 63.5 in | Wt 137.6 lb

## 2022-03-06 DIAGNOSIS — G4710 Hypersomnia, unspecified: Secondary | ICD-10-CM

## 2022-03-06 DIAGNOSIS — G4711 Idiopathic hypersomnia with long sleep time: Secondary | ICD-10-CM | POA: Insufficient documentation

## 2022-03-06 NOTE — Patient Instructions (Signed)
X Lab study followed by nap test

## 2022-03-06 NOTE — Addendum Note (Signed)
Addended by: Rigoberto Noel on: 03/06/2022 01:12 PM   Modules accepted: Orders

## 2022-03-06 NOTE — Assessment & Plan Note (Signed)
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 I do not feel that OSA is the main issue, both previous NPSG and home sleep test done by her dentist did not show significant AHI or desaturations.  Symptomatically she does have some choking and gagging in her sleep which may be related to her overbite however she is not very happy with the results of the oral appliance and would not continue this unless it is actually helping her. We will proceed with an PSG followed by MSLT to investigate for narcolepsy   The pathophysiology of obstructive sleep apnea , it's cardiovascular consequences & modes of treatment including CPAP were discused with the patient in detail & they evidenced understanding.
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
## 2022-04-14 DIAGNOSIS — J069 Acute upper respiratory infection, unspecified: Secondary | ICD-10-CM | POA: Diagnosis not present

## 2022-04-15 DIAGNOSIS — E782 Mixed hyperlipidemia: Secondary | ICD-10-CM | POA: Diagnosis not present

## 2022-04-15 DIAGNOSIS — N1831 Chronic kidney disease, stage 3a: Secondary | ICD-10-CM | POA: Diagnosis not present

## 2022-04-15 DIAGNOSIS — Z0000 Encounter for general adult medical examination without abnormal findings: Secondary | ICD-10-CM | POA: Diagnosis not present

## 2022-04-15 DIAGNOSIS — E1122 Type 2 diabetes mellitus with diabetic chronic kidney disease: Secondary | ICD-10-CM | POA: Diagnosis not present

## 2022-04-15 DIAGNOSIS — R5383 Other fatigue: Secondary | ICD-10-CM | POA: Diagnosis not present

## 2022-04-22 DIAGNOSIS — R29898 Other symptoms and signs involving the musculoskeletal system: Secondary | ICD-10-CM | POA: Diagnosis not present

## 2022-04-22 DIAGNOSIS — G603 Idiopathic progressive neuropathy: Secondary | ICD-10-CM | POA: Diagnosis not present

## 2022-04-22 DIAGNOSIS — N182 Chronic kidney disease, stage 2 (mild): Secondary | ICD-10-CM | POA: Diagnosis not present

## 2022-04-22 DIAGNOSIS — E1122 Type 2 diabetes mellitus with diabetic chronic kidney disease: Secondary | ICD-10-CM | POA: Diagnosis not present

## 2022-04-22 DIAGNOSIS — E782 Mixed hyperlipidemia: Secondary | ICD-10-CM | POA: Diagnosis not present

## 2022-04-22 DIAGNOSIS — E039 Hypothyroidism, unspecified: Secondary | ICD-10-CM | POA: Diagnosis not present

## 2022-04-22 DIAGNOSIS — F419 Anxiety disorder, unspecified: Secondary | ICD-10-CM | POA: Diagnosis not present

## 2022-04-22 DIAGNOSIS — Z0000 Encounter for general adult medical examination without abnormal findings: Secondary | ICD-10-CM | POA: Diagnosis not present

## 2022-04-28 ENCOUNTER — Telehealth: Payer: Self-pay | Admitting: Pulmonary Disease

## 2022-05-15 DIAGNOSIS — Z1231 Encounter for screening mammogram for malignant neoplasm of breast: Secondary | ICD-10-CM | POA: Diagnosis not present

## 2022-05-18 ENCOUNTER — Encounter (HOSPITAL_BASED_OUTPATIENT_CLINIC_OR_DEPARTMENT_OTHER): Payer: Medicare HMO | Admitting: Pulmonary Disease

## 2022-05-19 ENCOUNTER — Encounter (HOSPITAL_BASED_OUTPATIENT_CLINIC_OR_DEPARTMENT_OTHER): Payer: Medicare HMO | Admitting: Pulmonary Disease

## 2022-05-20 NOTE — Telephone Encounter (Signed)
Na

## 2022-06-02 ENCOUNTER — Ambulatory Visit (HOSPITAL_BASED_OUTPATIENT_CLINIC_OR_DEPARTMENT_OTHER): Payer: Medicare HMO | Attending: Pulmonary Disease | Admitting: Pulmonary Disease

## 2022-06-02 DIAGNOSIS — G478 Other sleep disorders: Secondary | ICD-10-CM | POA: Diagnosis not present

## 2022-06-02 DIAGNOSIS — E119 Type 2 diabetes mellitus without complications: Secondary | ICD-10-CM | POA: Diagnosis not present

## 2022-06-02 DIAGNOSIS — H2513 Age-related nuclear cataract, bilateral: Secondary | ICD-10-CM | POA: Diagnosis not present

## 2022-06-02 DIAGNOSIS — G4710 Hypersomnia, unspecified: Secondary | ICD-10-CM | POA: Diagnosis not present

## 2022-06-02 DIAGNOSIS — H16223 Keratoconjunctivitis sicca, not specified as Sjogren's, bilateral: Secondary | ICD-10-CM | POA: Diagnosis not present

## 2022-06-03 ENCOUNTER — Ambulatory Visit (HOSPITAL_BASED_OUTPATIENT_CLINIC_OR_DEPARTMENT_OTHER): Payer: Medicare HMO | Attending: Pulmonary Disease | Admitting: Pulmonary Disease

## 2022-06-03 DIAGNOSIS — G4710 Hypersomnia, unspecified: Secondary | ICD-10-CM | POA: Diagnosis not present

## 2022-06-03 DIAGNOSIS — G4711 Idiopathic hypersomnia with long sleep time: Secondary | ICD-10-CM | POA: Diagnosis not present

## 2022-06-09 DIAGNOSIS — G4710 Hypersomnia, unspecified: Secondary | ICD-10-CM

## 2022-06-09 NOTE — Procedures (Signed)
Patient Name: Charlotte [name], Charlotte [name] Date: 06/03/2022 Gender: Female D.O.B: Jun 28, 1950 Age (years): 76 Referring Provider: Kara Mead MD, ABSM Height (inches): 63 Interpreting Physician: Kara Mead MD, ABSM Weight (lbs): 133 RPSGT: Jacolyn Reedy BMI: 24 MRN: [id number] Neck Size: 17.00 <br> <br> CLINICAL INFORMATION Sleep Study Type: MSLT    The patient was referred to the sleep center for evaluation of daytime sleepiness.  Epworth Sleepiness Score:11  Most recent polysomnogram dated 10/17/2013 revealed an AHI of 3.1/h. Preceding NPSG showed AHI 4.8/h with RDI 9.7/h   SLEEP STUDY TECHNIQUE A Multiple Sleep Latency Test was performed after an overnight polysomnogram according to the AASM scoring manual v2.3 (April 2016) and clinical guidelines. Five nap opportunities occurred over the course of the test which followed an overnight polysomnogram. The channels recorded and monitored were frontal, central, and occipital electroencephalography (EEG), right and left electrooculogram (EOG), chin electromyography (EMG), and electrocardiogram (EKG).  MEDICATIONS Medications taken by the patient : lotomax, MAGNESIUM, PRAVASTATIN, SYSTANE Medications administered by patient during sleep study : No sleep medicine administered.   IMPRESSIONS - Total number of naps attempted: 5 . Total number of naps with sleep attained: 5 . The Mean Sleep Latency was 3.07 minutes. There were 0 sleep-onset REM periods. - The patient appears to have pathologic sleepiness, evidenced by a short mean sleep latency (8 minutes or less) on this MSLT.   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 - Return for follow up to evaluate other causes of excessive daytime sleepiness.   Kara Mead MD Board Certified in [place]

## 2022-06-09 NOTE — Procedures (Signed)
Patient Name: Charlotte [name], Charlotte [name] Date: 06/02/2022 Gender: Female D.O.B: 1950/09/07 Age (years): 28 Referring Provider: Kara Mead MD, ABSM Height (inches): 63 Interpreting Physician: Kara Mead MD, ABSM Weight (lbs): 133 RPSGT: Zadie Rhine BMI: 24 MRN: [id number] Neck Size: 17.00 <br> <br> CLINICAL INFORMATION Sleep Study Type: NPSG    Indication for sleep study: hypersomnolence, Non-refreshing Sleep, Snoring    Epworth Sleepiness Score:11    SLEEP STUDY TECHNIQUE As per the AASM Manual for the Scoring of Sleep and Associated Events v2.3 (April 2016) with a hypopnea requiring 4% desaturations.  The channels recorded and monitored were frontal, central and occipital EEG, electrooculogram (EOG), submentalis EMG (chin), nasal and oral airflow, thoracic and abdominal wall motion, anterior tibialis EMG, snore microphone, electrocardiogram, and pulse oximetry.  MEDICATIONS Medications self-administered by patient taken the night of the study : lotomax, MAGNESIUM, PRAVASTATIN, SYSTANE  SLEEP ARCHITECTURE The study was initiated at 10:46:57 PM and ended at 5:58:41 AM.  Sleep onset time was 6.6 minutes and the sleep efficiency was 87.4%. The total sleep time was 377.5 minutes.  Stage REM latency was 56.0 minutes.  The patient spent 3.3% of the night in stage N1 sleep, 49.9% in stage N2 sleep, 18.7% in stage N3 and 28.1% in REM.  Alpha intrusion was absent.  Supine sleep was 3.04%.  RESPIRATORY PARAMETERS The overall apnea/hypopnea index (AHI) was 4.8 per hour. There were 27 total apneas, including 23 obstructive, 4 central and 0 mixed apneas. There were 3 hypopneas and 31 RERAs.  The AHI during Stage REM sleep was 12.5 per hour.  AHI while supine was 10.4 per hour.  The mean oxygen saturation was 93.9%. The minimum SpO2 during sleep was 78.0%.  loud snoring was noted during this study.  CARDIAC DATA The 2 lead EKG demonstrated sinus rhythm. The mean heart rate was  55.2 beats per minute. Other EKG findings include: None.   LEG MOVEMENT DATA The total PLMS were 0 with a resulting PLMS index of 0.0. Associated arousal with leg movement index was 0.0 .  IMPRESSIONS - Mild obstructive sleep apnea occurred during this study (AHI = 4.8/h, RDI 9.7/h) - No significant central sleep apnea occurred during this study (CAI = 0.6/h). - Moderate oxygen desaturation was noted during this study (Min O2 = 78.0%). - The patient snored with loud snoring volume. - No cardiac abnormalities were noted during this study. - Clinically significant periodic limb movements did not occur during sleep. No significant associated arousals.   DIAGNOSIS - Upper airway resistance syndrome   RECOMMENDATIONS - If no other etiology of hypersomnolence foundm consider treating for OSA Avoid alcohol, sedatives and other CNS depressants that may worsen sleep apnea and disrupt normal sleep architecture. - Sleep hygiene should be reviewed to assess factors that may improve sleep quality. - Weight management and regular exercise should be initiated or continued if appropriate.   Kara Mead MD Board Certified in [place]

## 2022-06-16 IMAGING — DX DG KNEE COMPLETE 4+V*L*
4 series · 4 of 4 positions shown · non-contrast
Comparison: None.

CLINICAL DATA: Fall 9 days ago, persistent pain

EXAM:
LEFT KNEE - COMPLETE 4+ VIEW

[knee ap]
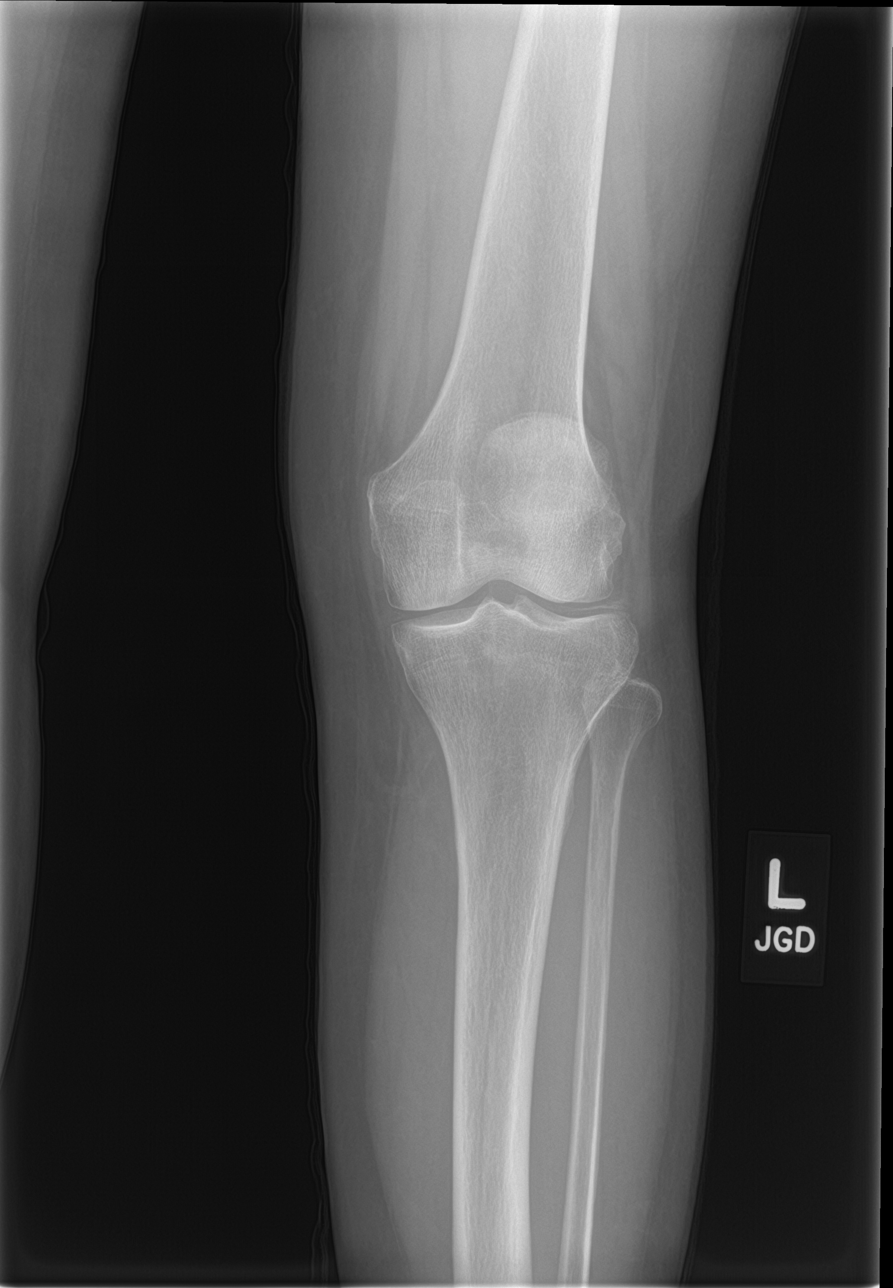

[knee obl (1 of 2)]
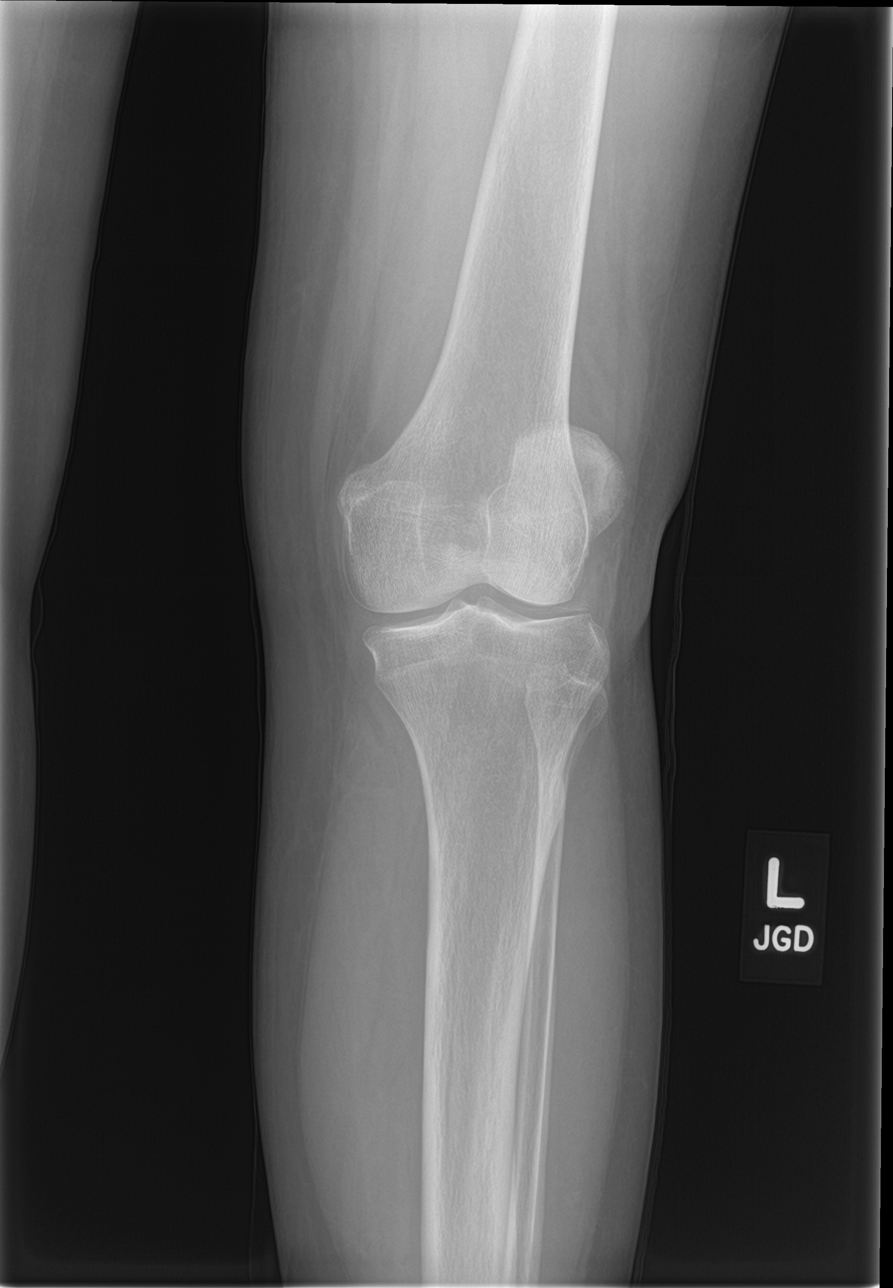

[knee obl (2 of 2)]
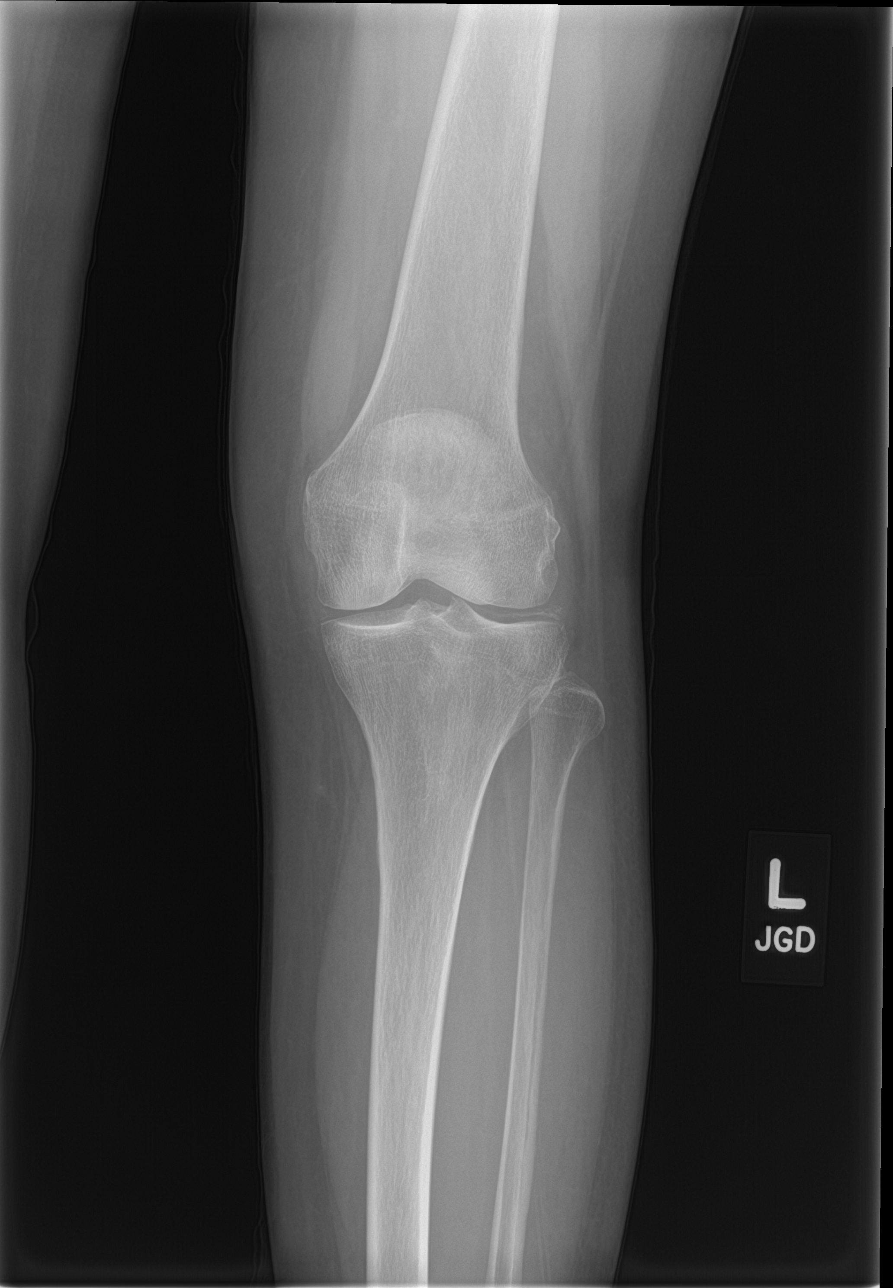

[knee lat]
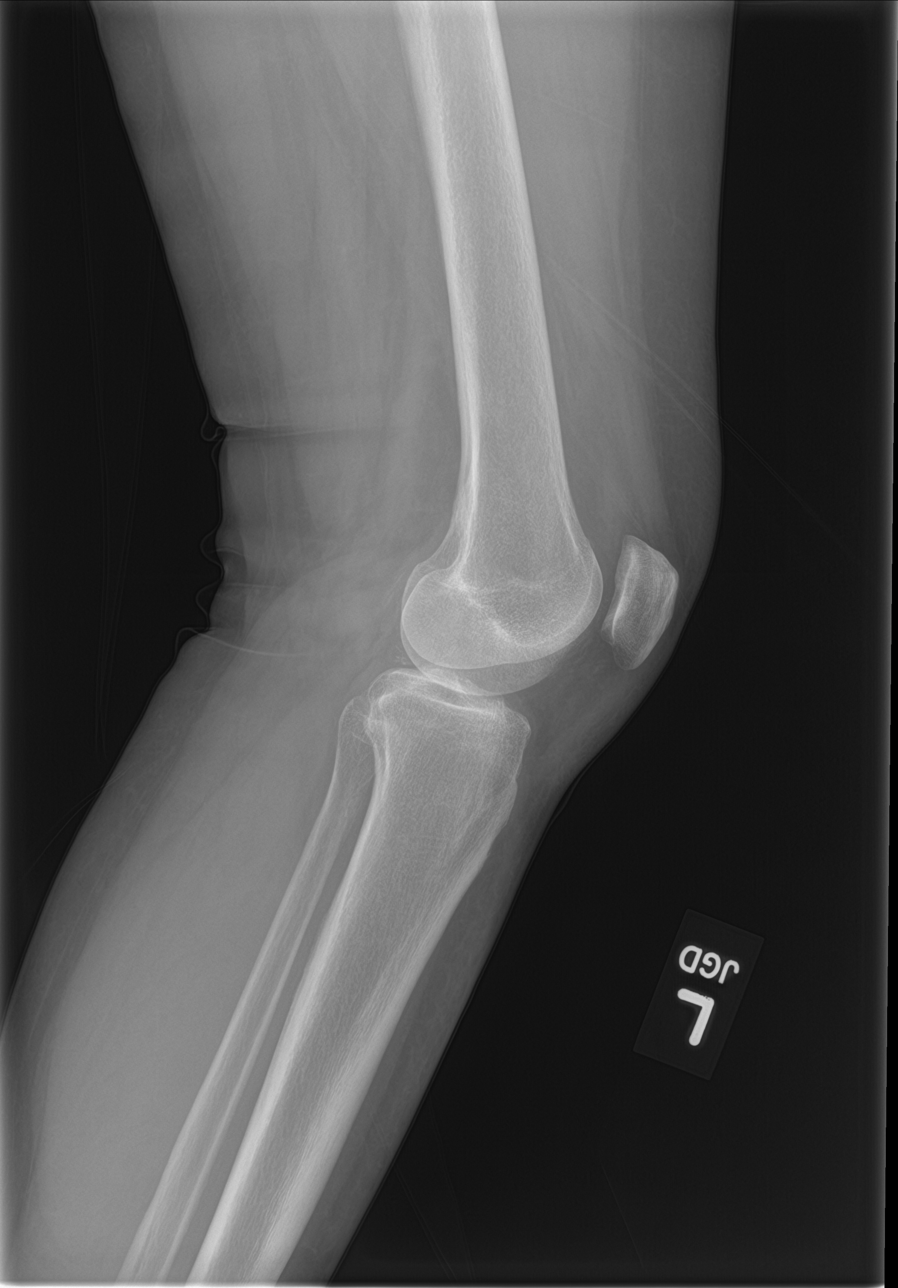

[4 of 4 positions shown; findings below may reference images not displayed]

FINDINGS: No evidence of fracture, dislocation, or joint effusion. No evidence
of arthropathy or other focal bone abnormality. Soft tissues are
unremarkable.
IMPRESSION: No fracture or dislocation of the left knee. No knee joint effusion.
Joint spaces are preserved.

## 2022-06-18 ENCOUNTER — Encounter (HOSPITAL_BASED_OUTPATIENT_CLINIC_OR_DEPARTMENT_OTHER): Payer: Self-pay | Admitting: Pulmonary Disease

## 2022-06-18 ENCOUNTER — Ambulatory Visit (HOSPITAL_BASED_OUTPATIENT_CLINIC_OR_DEPARTMENT_OTHER): Payer: Medicare HMO | Admitting: Pulmonary Disease

## 2022-06-18 VITALS — BP 116/68 | HR 54 | Temp 97.7°F | Ht 63.0 in | Wt 137.2 lb

## 2022-06-18 DIAGNOSIS — G4711 Idiopathic hypersomnia with long sleep time: Secondary | ICD-10-CM

## 2022-06-18 MED ORDER — MODAFINIL 100 MG PO TABS: 100.0000 mg | ORAL_TABLET | Freq: Every day | ORAL | 1 refills | Status: DC

## 2022-06-18 NOTE — Assessment & Plan Note (Signed)
Mean sleep latency is low indicating a higher degree of sleepiness than normal.  However she did not meet SOR EM criteria for narcolepsy. We discussed use of stimulants for this condition.  We also discussed with sleep hygiene measures including adequate sleep time. We discussed cardiovascular implications of stimulant use.  She reports being very sensitive to medications.  We will proceed with modafinil 100 mg which she can take between 9 and 10 AM.  We will either titrate dose or adjust timing based on her response.

## 2022-06-18 NOTE — Patient Instructions (Signed)
You have idiopathic hypersomnolence  Trial of Modafinil 100 mg daily as needed- we discussed side effects

## 2022-06-18 NOTE — Progress Notes (Signed)
   Subjective:    Patient ID: Charlotte [name], female    DOB: 16-May-1950, 72 y.o.   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 Symptomatically she has some choking and gagging in her sleep which may be related to her overbite however she is not very happy with the results of the oral appliance  She stopped using her dental appliance. We reviewed overnight sleep study and MSLT in detail today. She does report that she normally sleeps on her side and had to sleep supine for the study     Significant tests/ events reviewed  05/2022 NPSG AHI 4.8/h, RDI 10/h, low sat 78%, mostly supine events MSLT 05/2022  3.1 mins, 0/5 SOREMs 10/2013 NPSG-125 pounds-TST 286 minutes, AHI 3.1/hour   07/2021 HST  -pAHI 4.8/hour, lowest desaturation 88 % 01/2022 HST with oral appliance-pAHI 2.6/hour, lowest desaturation 92%  Review of Systems neg for any significant sore throat, dysphagia, itching, sneezing, nasal congestion or excess/ purulent secretions, fever, chills, sweats, unintended wt loss, pleuritic or exertional cp, hempoptysis, orthopnea pnd or change in chronic leg swelling. Also denies presyncope, palpitations, heartburn, abdominal pain, nausea, vomiting, diarrhea or change in bowel or urinary habits, dysuria,hematuria, rash, arthralgias, visual complaints, headache, numbness weakness or ataxia.     Objective:   Physical Exam  Gen. Pleasant, well-nourished, in no distress, normal affect ENT - no pallor,icterus, no post nasal drip Neck: No JVD, no thyromegaly, no carotid bruits Lungs: no use of accessory muscles, no dullness to percussion, clear without rales or rhonchi  Cardiovascular: Rhythm regular, heart sounds  normal, no murmurs or gallops, no peripheral edema Abdomen: soft and  non-tender, no hepatosplenomegaly, BS normal. Musculoskeletal: No deformities, no cyanosis or clubbing Neuro:  alert, non focal       Assessment & Plan:    OSA -very mild and positional therapy should suffice.  Avoid sleeping in the supine position

## 2022-06-28 ENCOUNTER — Other Ambulatory Visit (HOSPITAL_BASED_OUTPATIENT_CLINIC_OR_DEPARTMENT_OTHER): Payer: Self-pay | Admitting: Pulmonary Disease

## 2022-07-08 ENCOUNTER — Telehealth: Payer: Self-pay

## 2022-07-08 NOTE — Telephone Encounter (Signed)
PA request received via CMM for Modafinil 100MG  tablets  PA has been submitted to [facility] and is pending determination  Key: [id number]

## 2022-07-11 NOTE — Telephone Encounter (Signed)
PA has been APPROVED from 07/08/2022-04/06/2023

## 2022-07-28 DIAGNOSIS — E039 Hypothyroidism, unspecified: Secondary | ICD-10-CM | POA: Diagnosis not present

## 2022-08-04 DIAGNOSIS — E039 Hypothyroidism, unspecified: Secondary | ICD-10-CM | POA: Diagnosis not present

## 2022-08-08 DIAGNOSIS — H524 Presbyopia: Secondary | ICD-10-CM | POA: Diagnosis not present

## 2022-08-08 DIAGNOSIS — H5213 Myopia, bilateral: Secondary | ICD-10-CM | POA: Diagnosis not present

## 2022-08-08 DIAGNOSIS — H52203 Unspecified astigmatism, bilateral: Secondary | ICD-10-CM | POA: Diagnosis not present

## 2022-08-08 DIAGNOSIS — Z0100 Encounter for examination of eyes and vision without abnormal findings: Secondary | ICD-10-CM | POA: Diagnosis not present

## 2022-08-21 DIAGNOSIS — H0288B Meibomian gland dysfunction left eye, upper and lower eyelids: Secondary | ICD-10-CM | POA: Diagnosis not present

## 2022-08-21 DIAGNOSIS — H0288A Meibomian gland dysfunction right eye, upper and lower eyelids: Secondary | ICD-10-CM | POA: Diagnosis not present

## 2022-08-21 DIAGNOSIS — H16223 Keratoconjunctivitis sicca, not specified as Sjogren's, bilateral: Secondary | ICD-10-CM | POA: Diagnosis not present

## 2022-10-14 DIAGNOSIS — R29898 Other symptoms and signs involving the musculoskeletal system: Secondary | ICD-10-CM | POA: Diagnosis not present

## 2022-10-14 DIAGNOSIS — N182 Chronic kidney disease, stage 2 (mild): Secondary | ICD-10-CM | POA: Diagnosis not present

## 2022-10-14 DIAGNOSIS — Z0000 Encounter for general adult medical examination without abnormal findings: Secondary | ICD-10-CM | POA: Diagnosis not present

## 2022-10-14 DIAGNOSIS — E782 Mixed hyperlipidemia: Secondary | ICD-10-CM | POA: Diagnosis not present

## 2022-10-14 DIAGNOSIS — E039 Hypothyroidism, unspecified: Secondary | ICD-10-CM | POA: Diagnosis not present

## 2022-10-14 DIAGNOSIS — G603 Idiopathic progressive neuropathy: Secondary | ICD-10-CM | POA: Diagnosis not present

## 2022-10-21 DIAGNOSIS — F419 Anxiety disorder, unspecified: Secondary | ICD-10-CM | POA: Diagnosis not present

## 2022-10-21 DIAGNOSIS — G603 Idiopathic progressive neuropathy: Secondary | ICD-10-CM | POA: Diagnosis not present

## 2022-10-21 DIAGNOSIS — E039 Hypothyroidism, unspecified: Secondary | ICD-10-CM | POA: Diagnosis not present

## 2022-10-21 DIAGNOSIS — E782 Mixed hyperlipidemia: Secondary | ICD-10-CM | POA: Diagnosis not present

## 2022-10-21 DIAGNOSIS — E1122 Type 2 diabetes mellitus with diabetic chronic kidney disease: Secondary | ICD-10-CM | POA: Diagnosis not present

## 2022-10-21 DIAGNOSIS — N182 Chronic kidney disease, stage 2 (mild): Secondary | ICD-10-CM | POA: Diagnosis not present

## 2022-11-24 ENCOUNTER — Encounter (HOSPITAL_BASED_OUTPATIENT_CLINIC_OR_DEPARTMENT_OTHER): Payer: Self-pay | Admitting: Pulmonary Disease

## 2022-11-24 ENCOUNTER — Ambulatory Visit (HOSPITAL_BASED_OUTPATIENT_CLINIC_OR_DEPARTMENT_OTHER): Payer: Medicare HMO | Admitting: Pulmonary Disease

## 2022-11-24 VITALS — BP 120/60 | HR 60 | Resp 17 | Ht 63.0 in | Wt 128.0 lb

## 2022-11-24 DIAGNOSIS — G4711 Idiopathic hypersomnia with long sleep time: Secondary | ICD-10-CM

## 2022-11-24 NOTE — Patient Instructions (Signed)
Continue 1/2 tab modafinil  For body clock issues, try sunlight exposure around 9am x Melatonin 5-10 mg about 2h prior to intended bed time

## 2022-11-24 NOTE — Progress Notes (Signed)
   Subjective:    Patient ID: Charlotte [name], female    DOB: 1950/11/30, 72 y.o.   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 We started her on modafinil 06/2022.  She reports significant improvement she is only using half tablet/50 mg.  She takes this around 8 AM and this seems to last her through the day. She is able to focus on her work and has increased energy levels. She had a trip to [place] and she reports that since returning about 6 weeks ago her sleep has been messed up. Her bedtime is now as late as 1 AM and she cannot get up sooner than 9 AM.   Significant tests/ events reviewed   05/2022 NPSG AHI 4.8/h, RDI 10/h, low sat 78%, mostly supine events MSLT 05/2022  3.1 mins, 0/5 SOREMs 10/2013 NPSG-125 pounds-TST 286 minutes, AHI 3.1/hour   07/2021 HST  -pAHI 4.8/hour, lowest desaturation 88 % 01/2022 HST with oral appliance-pAHI 2.6/hour, lowest desaturation 92%  Review of Systems neg for any significant sore throat, dysphagia, itching, sneezing, nasal congestion or excess/ purulent secretions, fever, chills, sweats, unintended wt loss, pleuritic or exertional cp, hempoptysis, orthopnea pnd or change in chronic leg swelling. Also denies presyncope, palpitations, heartburn, abdominal pain, nausea, vomiting, diarrhea or change in bowel or urinary habits, dysuria,hematuria, rash, arthralgias, visual complaints, headache, numbness weakness or ataxia.     Objective:   Physical Exam  Gen. Pleasant, well-nourished, in no distress ENT - no thrush, no pallor/icterus,no post nasal drip, 1 mm overbite Neck: No JVD, no thyromegaly, no carotid bruits Lungs: no use of accessory muscles, no dullness to percussion, clear without rales or rhonchi  Cardiovascular: Rhythm regular, heart sounds  normal, no murmurs  or gallops, no peripheral edema Musculoskeletal: No deformities, no cyanosis or clubbing        Assessment & Plan:    Circadian rhythm disorder/delayed sleep phase syndrome after her recent trip to [place] -we discussed for shifting back 2 hours using melatonin 2 hours prior to bedtime and sunlight exposure in the morning

## 2022-11-24 NOTE — Assessment & Plan Note (Signed)
She seems to be tolerating modafinil well without cardiac side effects. Will continue current dose of half tablet/50 mg daily.

## 2022-12-11 DIAGNOSIS — M9901 Segmental and somatic dysfunction of cervical region: Secondary | ICD-10-CM | POA: Diagnosis not present

## 2022-12-11 DIAGNOSIS — M5412 Radiculopathy, cervical region: Secondary | ICD-10-CM | POA: Diagnosis not present

## 2022-12-16 DIAGNOSIS — H6123 Impacted cerumen, bilateral: Secondary | ICD-10-CM | POA: Diagnosis not present

## 2022-12-17 DIAGNOSIS — M5412 Radiculopathy, cervical region: Secondary | ICD-10-CM | POA: Diagnosis not present

## 2022-12-17 DIAGNOSIS — M9901 Segmental and somatic dysfunction of cervical region: Secondary | ICD-10-CM | POA: Diagnosis not present

## 2022-12-18 DIAGNOSIS — M9901 Segmental and somatic dysfunction of cervical region: Secondary | ICD-10-CM | POA: Diagnosis not present

## 2022-12-18 DIAGNOSIS — M5412 Radiculopathy, cervical region: Secondary | ICD-10-CM | POA: Diagnosis not present

## 2022-12-19 DIAGNOSIS — M5412 Radiculopathy, cervical region: Secondary | ICD-10-CM | POA: Diagnosis not present

## 2022-12-19 DIAGNOSIS — M9901 Segmental and somatic dysfunction of cervical region: Secondary | ICD-10-CM | POA: Diagnosis not present

## 2022-12-23 DIAGNOSIS — M9901 Segmental and somatic dysfunction of cervical region: Secondary | ICD-10-CM | POA: Diagnosis not present

## 2022-12-23 DIAGNOSIS — M5412 Radiculopathy, cervical region: Secondary | ICD-10-CM | POA: Diagnosis not present

## 2022-12-25 DIAGNOSIS — M5412 Radiculopathy, cervical region: Secondary | ICD-10-CM | POA: Diagnosis not present

## 2022-12-25 DIAGNOSIS — M9901 Segmental and somatic dysfunction of cervical region: Secondary | ICD-10-CM | POA: Diagnosis not present

## 2022-12-26 DIAGNOSIS — M9901 Segmental and somatic dysfunction of cervical region: Secondary | ICD-10-CM | POA: Diagnosis not present

## 2022-12-26 DIAGNOSIS — M5412 Radiculopathy, cervical region: Secondary | ICD-10-CM | POA: Diagnosis not present

## 2022-12-30 DIAGNOSIS — M5412 Radiculopathy, cervical region: Secondary | ICD-10-CM | POA: Diagnosis not present

## 2022-12-30 DIAGNOSIS — M9901 Segmental and somatic dysfunction of cervical region: Secondary | ICD-10-CM | POA: Diagnosis not present

## 2023-01-01 DIAGNOSIS — M5412 Radiculopathy, cervical region: Secondary | ICD-10-CM | POA: Diagnosis not present

## 2023-01-01 DIAGNOSIS — M9901 Segmental and somatic dysfunction of cervical region: Secondary | ICD-10-CM | POA: Diagnosis not present

## 2023-01-06 DIAGNOSIS — M5412 Radiculopathy, cervical region: Secondary | ICD-10-CM | POA: Diagnosis not present

## 2023-01-06 DIAGNOSIS — M9901 Segmental and somatic dysfunction of cervical region: Secondary | ICD-10-CM | POA: Diagnosis not present

## 2023-01-09 DIAGNOSIS — M65342 Trigger finger, left ring finger: Secondary | ICD-10-CM | POA: Diagnosis not present

## 2023-01-09 DIAGNOSIS — M5412 Radiculopathy, cervical region: Secondary | ICD-10-CM | POA: Diagnosis not present

## 2023-01-09 DIAGNOSIS — M9901 Segmental and somatic dysfunction of cervical region: Secondary | ICD-10-CM | POA: Diagnosis not present

## 2023-01-13 DIAGNOSIS — M9901 Segmental and somatic dysfunction of cervical region: Secondary | ICD-10-CM | POA: Diagnosis not present

## 2023-01-13 DIAGNOSIS — M5412 Radiculopathy, cervical region: Secondary | ICD-10-CM | POA: Diagnosis not present

## 2023-01-16 DIAGNOSIS — M9901 Segmental and somatic dysfunction of cervical region: Secondary | ICD-10-CM | POA: Diagnosis not present

## 2023-01-16 DIAGNOSIS — M5412 Radiculopathy, cervical region: Secondary | ICD-10-CM | POA: Diagnosis not present

## 2023-01-22 ENCOUNTER — Other Ambulatory Visit (HOSPITAL_BASED_OUTPATIENT_CLINIC_OR_DEPARTMENT_OTHER): Payer: Self-pay | Admitting: Pulmonary Disease

## 2023-01-24 NOTE — Telephone Encounter (Signed)
Please advise on refill request

## 2023-02-02 DIAGNOSIS — Z23 Encounter for immunization: Secondary | ICD-10-CM | POA: Diagnosis not present

## 2023-02-25 DIAGNOSIS — D225 Melanocytic nevi of trunk: Secondary | ICD-10-CM | POA: Diagnosis not present

## 2023-02-25 DIAGNOSIS — Z1283 Encounter for screening for malignant neoplasm of skin: Secondary | ICD-10-CM | POA: Diagnosis not present

## 2023-02-25 DIAGNOSIS — L720 Epidermal cyst: Secondary | ICD-10-CM | POA: Diagnosis not present

## 2023-02-26 DIAGNOSIS — M7542 Impingement syndrome of left shoulder: Secondary | ICD-10-CM | POA: Diagnosis not present

## 2023-03-23 DIAGNOSIS — M75112 Incomplete rotator cuff tear or rupture of left shoulder, not specified as traumatic: Secondary | ICD-10-CM | POA: Diagnosis not present

## 2023-03-23 DIAGNOSIS — M7542 Impingement syndrome of left shoulder: Secondary | ICD-10-CM | POA: Diagnosis not present

## 2023-03-23 DIAGNOSIS — M7522 Bicipital tendinitis, left shoulder: Secondary | ICD-10-CM | POA: Diagnosis not present

## 2023-03-23 DIAGNOSIS — M25512 Pain in left shoulder: Secondary | ICD-10-CM | POA: Diagnosis not present

## 2023-03-23 DIAGNOSIS — M25612 Stiffness of left shoulder, not elsewhere classified: Secondary | ICD-10-CM | POA: Diagnosis not present

## 2023-03-26 DIAGNOSIS — H16223 Keratoconjunctivitis sicca, not specified as Sjogren's, bilateral: Secondary | ICD-10-CM | POA: Diagnosis not present

## 2023-03-26 DIAGNOSIS — H00021 Hordeolum internum right upper eyelid: Secondary | ICD-10-CM | POA: Diagnosis not present

## 2023-03-27 ENCOUNTER — Other Ambulatory Visit (HOSPITAL_COMMUNITY): Payer: Self-pay

## 2023-03-27 DIAGNOSIS — M7542 Impingement syndrome of left shoulder: Secondary | ICD-10-CM | POA: Diagnosis not present

## 2023-03-27 DIAGNOSIS — M75112 Incomplete rotator cuff tear or rupture of left shoulder, not specified as traumatic: Secondary | ICD-10-CM | POA: Diagnosis not present

## 2023-03-27 DIAGNOSIS — M25612 Stiffness of left shoulder, not elsewhere classified: Secondary | ICD-10-CM | POA: Diagnosis not present

## 2023-03-27 DIAGNOSIS — M7522 Bicipital tendinitis, left shoulder: Secondary | ICD-10-CM | POA: Diagnosis not present

## 2023-03-27 DIAGNOSIS — M25512 Pain in left shoulder: Secondary | ICD-10-CM | POA: Diagnosis not present

## 2023-03-30 ENCOUNTER — Other Ambulatory Visit (HOSPITAL_COMMUNITY): Payer: Self-pay

## 2023-03-30 ENCOUNTER — Other Ambulatory Visit (HOSPITAL_BASED_OUTPATIENT_CLINIC_OR_DEPARTMENT_OTHER): Payer: Self-pay

## 2023-03-30 MED ORDER — XIIDRA 5 % OP SOLN: 1.0000 [drp] | Freq: Two times a day (BID) | OPHTHALMIC | 3 refills | Status: AC

## 2023-03-31 ENCOUNTER — Other Ambulatory Visit (HOSPITAL_COMMUNITY): Payer: Self-pay

## 2023-03-31 MED ORDER — LEVOTHYROXINE SODIUM 75 MCG PO TABS
75.0000 ug | ORAL_TABLET | Freq: Every day | ORAL | 5 refills | Status: DC
  Filled 2023-03-31: qty 30, 30d supply, fill #0
  Filled 2023-04-24: qty 90, 90d supply, fill #0
  Filled 2023-07-29: qty 90, 90d supply, fill #1

## 2023-03-31 MED ORDER — PRAVASTATIN SODIUM 20 MG PO TABS
20.0000 mg | ORAL_TABLET | Freq: Every day | ORAL | 0 refills | Status: DC
  Filled 2023-03-31: qty 90, 90d supply, fill #0

## 2023-03-31 MED ORDER — XIIDRA 5 % OP SOLN: 1.0000 [drp] | Freq: Two times a day (BID) | OPHTHALMIC | 3 refills | Status: DC

## 2023-03-31 MED ORDER — PRAVASTATIN SODIUM 20 MG PO TABS
20.0000 mg | ORAL_TABLET | Freq: Every day | ORAL | 1 refills | Status: DC
  Filled 2023-03-31: qty 90, 90d supply, fill #0

## 2023-04-02 ENCOUNTER — Other Ambulatory Visit (HOSPITAL_COMMUNITY): Payer: Self-pay

## 2023-04-02 ENCOUNTER — Other Ambulatory Visit (HOSPITAL_BASED_OUTPATIENT_CLINIC_OR_DEPARTMENT_OTHER): Payer: Self-pay | Admitting: Pulmonary Disease

## 2023-04-04 NOTE — Telephone Encounter (Signed)
Message routed to Dr Elsworth Soho to advise

## 2023-04-07 ENCOUNTER — Other Ambulatory Visit (HOSPITAL_COMMUNITY): Payer: Self-pay

## 2023-04-07 MED ORDER — MODAFINIL 100 MG PO TABS: 100.0000 mg | ORAL_TABLET | Freq: Every day | ORAL | 0 refills | Status: DC

## 2023-04-07 NOTE — Telephone Encounter (Addendum)
 Rx sent as per Dr. Vassie Loll  last rx   Please advice her it is 1/2 tab daily  Please make sure she is aware

## 2023-04-07 NOTE — Telephone Encounter (Signed)
 [facility].

## 2023-04-07 NOTE — Telephone Encounter (Signed)
 I will send 1 refill Where does rx need to go to ?  Note was reviewed and Dr. Vassie Loll recommended continue .

## 2023-04-07 NOTE — Telephone Encounter (Signed)
PT is calling agian for this RX. First req on 12/28 and Dr. Laury Axon to address, please. Also, note her new pharm needs a actual call for the first RX to be filled.   Her # is [phone]

## 2023-04-07 NOTE — Telephone Encounter (Signed)
I have notified the patient. Nothing further needed. 

## 2023-04-07 NOTE — Telephone Encounter (Signed)
I spoke with the patient. She will run out of her medication on Friday.   Dr. Vassie Loll is out of the office until Monday. Tammy can you refill the prescription?

## 2023-04-20 DIAGNOSIS — M25512 Pain in left shoulder: Secondary | ICD-10-CM | POA: Diagnosis not present

## 2023-04-20 DIAGNOSIS — M25612 Stiffness of left shoulder, not elsewhere classified: Secondary | ICD-10-CM | POA: Diagnosis not present

## 2023-04-20 DIAGNOSIS — M7522 Bicipital tendinitis, left shoulder: Secondary | ICD-10-CM | POA: Diagnosis not present

## 2023-04-20 DIAGNOSIS — M7542 Impingement syndrome of left shoulder: Secondary | ICD-10-CM | POA: Diagnosis not present

## 2023-04-20 DIAGNOSIS — M75112 Incomplete rotator cuff tear or rupture of left shoulder, not specified as traumatic: Secondary | ICD-10-CM | POA: Diagnosis not present

## 2023-04-22 DIAGNOSIS — M7542 Impingement syndrome of left shoulder: Secondary | ICD-10-CM | POA: Diagnosis not present

## 2023-04-22 DIAGNOSIS — M25512 Pain in left shoulder: Secondary | ICD-10-CM | POA: Diagnosis not present

## 2023-04-22 DIAGNOSIS — M7522 Bicipital tendinitis, left shoulder: Secondary | ICD-10-CM | POA: Diagnosis not present

## 2023-04-22 DIAGNOSIS — M75112 Incomplete rotator cuff tear or rupture of left shoulder, not specified as traumatic: Secondary | ICD-10-CM | POA: Diagnosis not present

## 2023-04-22 DIAGNOSIS — M25612 Stiffness of left shoulder, not elsewhere classified: Secondary | ICD-10-CM | POA: Diagnosis not present

## 2023-04-24 ENCOUNTER — Other Ambulatory Visit (HOSPITAL_COMMUNITY): Payer: Self-pay

## 2023-04-28 DIAGNOSIS — E1122 Type 2 diabetes mellitus with diabetic chronic kidney disease: Secondary | ICD-10-CM | POA: Diagnosis not present

## 2023-04-28 DIAGNOSIS — Z0000 Encounter for general adult medical examination without abnormal findings: Secondary | ICD-10-CM | POA: Diagnosis not present

## 2023-04-28 DIAGNOSIS — G603 Idiopathic progressive neuropathy: Secondary | ICD-10-CM | POA: Diagnosis not present

## 2023-04-28 DIAGNOSIS — E039 Hypothyroidism, unspecified: Secondary | ICD-10-CM | POA: Diagnosis not present

## 2023-04-28 DIAGNOSIS — E782 Mixed hyperlipidemia: Secondary | ICD-10-CM | POA: Diagnosis not present

## 2023-04-28 DIAGNOSIS — N182 Chronic kidney disease, stage 2 (mild): Secondary | ICD-10-CM | POA: Diagnosis not present

## 2023-04-29 ENCOUNTER — Other Ambulatory Visit (HOSPITAL_COMMUNITY): Payer: Self-pay

## 2023-04-29 LAB — HEMOGLOBIN A1C: A1c: 6.8

## 2023-04-29 LAB — TSH: TSH: 0.38 — AB (ref 0.41–5.90)

## 2023-04-29 LAB — COMPREHENSIVE METABOLIC PANEL (CC13): EGFR: 73

## 2023-04-29 MED ORDER — CYCLOSPORINE 0.05 % OP EMUL
1.0000 [drp] | Freq: Two times a day (BID) | OPHTHALMIC | 0 refills | Status: DC
  Filled 2023-04-29: qty 60, 30d supply, fill #0

## 2023-04-30 LAB — MICROALBUMIN / CREATININE URINE RATIO
Albumin, Urine POC: 3.2
Creatinine, POC: 27.9 mg/dL
Microalb Creat Ratio: 11

## 2023-05-04 DIAGNOSIS — M7522 Bicipital tendinitis, left shoulder: Secondary | ICD-10-CM | POA: Diagnosis not present

## 2023-05-04 DIAGNOSIS — M25612 Stiffness of left shoulder, not elsewhere classified: Secondary | ICD-10-CM | POA: Diagnosis not present

## 2023-05-04 DIAGNOSIS — M25512 Pain in left shoulder: Secondary | ICD-10-CM | POA: Diagnosis not present

## 2023-05-04 DIAGNOSIS — M75112 Incomplete rotator cuff tear or rupture of left shoulder, not specified as traumatic: Secondary | ICD-10-CM | POA: Diagnosis not present

## 2023-05-04 DIAGNOSIS — M7542 Impingement syndrome of left shoulder: Secondary | ICD-10-CM | POA: Diagnosis not present

## 2023-05-05 DIAGNOSIS — R29898 Other symptoms and signs involving the musculoskeletal system: Secondary | ICD-10-CM | POA: Diagnosis not present

## 2023-05-05 DIAGNOSIS — Z0000 Encounter for general adult medical examination without abnormal findings: Secondary | ICD-10-CM | POA: Diagnosis not present

## 2023-05-05 DIAGNOSIS — F419 Anxiety disorder, unspecified: Secondary | ICD-10-CM | POA: Diagnosis not present

## 2023-05-05 DIAGNOSIS — E1122 Type 2 diabetes mellitus with diabetic chronic kidney disease: Secondary | ICD-10-CM | POA: Diagnosis not present

## 2023-05-05 DIAGNOSIS — M792 Neuralgia and neuritis, unspecified: Secondary | ICD-10-CM | POA: Diagnosis not present

## 2023-05-05 DIAGNOSIS — E782 Mixed hyperlipidemia: Secondary | ICD-10-CM | POA: Diagnosis not present

## 2023-05-05 DIAGNOSIS — N182 Chronic kidney disease, stage 2 (mild): Secondary | ICD-10-CM | POA: Diagnosis not present

## 2023-05-05 DIAGNOSIS — G603 Idiopathic progressive neuropathy: Secondary | ICD-10-CM | POA: Diagnosis not present

## 2023-05-05 DIAGNOSIS — E039 Hypothyroidism, unspecified: Secondary | ICD-10-CM | POA: Diagnosis not present

## 2023-05-06 DIAGNOSIS — M25612 Stiffness of left shoulder, not elsewhere classified: Secondary | ICD-10-CM | POA: Diagnosis not present

## 2023-05-06 DIAGNOSIS — M7542 Impingement syndrome of left shoulder: Secondary | ICD-10-CM | POA: Diagnosis not present

## 2023-05-06 DIAGNOSIS — M7522 Bicipital tendinitis, left shoulder: Secondary | ICD-10-CM | POA: Diagnosis not present

## 2023-05-06 DIAGNOSIS — M25512 Pain in left shoulder: Secondary | ICD-10-CM | POA: Diagnosis not present

## 2023-05-06 DIAGNOSIS — M75112 Incomplete rotator cuff tear or rupture of left shoulder, not specified as traumatic: Secondary | ICD-10-CM | POA: Diagnosis not present

## 2023-05-12 DIAGNOSIS — M25512 Pain in left shoulder: Secondary | ICD-10-CM | POA: Diagnosis not present

## 2023-05-12 DIAGNOSIS — M25612 Stiffness of left shoulder, not elsewhere classified: Secondary | ICD-10-CM | POA: Diagnosis not present

## 2023-05-12 DIAGNOSIS — M7522 Bicipital tendinitis, left shoulder: Secondary | ICD-10-CM | POA: Diagnosis not present

## 2023-05-12 DIAGNOSIS — M75112 Incomplete rotator cuff tear or rupture of left shoulder, not specified as traumatic: Secondary | ICD-10-CM | POA: Diagnosis not present

## 2023-05-15 DIAGNOSIS — M7522 Bicipital tendinitis, left shoulder: Secondary | ICD-10-CM | POA: Diagnosis not present

## 2023-05-15 DIAGNOSIS — M25612 Stiffness of left shoulder, not elsewhere classified: Secondary | ICD-10-CM | POA: Diagnosis not present

## 2023-05-15 DIAGNOSIS — M25512 Pain in left shoulder: Secondary | ICD-10-CM | POA: Diagnosis not present

## 2023-05-15 DIAGNOSIS — M75112 Incomplete rotator cuff tear or rupture of left shoulder, not specified as traumatic: Secondary | ICD-10-CM | POA: Diagnosis not present

## 2023-05-18 DIAGNOSIS — M25512 Pain in left shoulder: Secondary | ICD-10-CM | POA: Diagnosis not present

## 2023-05-18 DIAGNOSIS — M7522 Bicipital tendinitis, left shoulder: Secondary | ICD-10-CM | POA: Diagnosis not present

## 2023-05-18 DIAGNOSIS — M75112 Incomplete rotator cuff tear or rupture of left shoulder, not specified as traumatic: Secondary | ICD-10-CM | POA: Diagnosis not present

## 2023-05-18 DIAGNOSIS — M25612 Stiffness of left shoulder, not elsewhere classified: Secondary | ICD-10-CM | POA: Diagnosis not present

## 2023-05-21 DIAGNOSIS — M25612 Stiffness of left shoulder, not elsewhere classified: Secondary | ICD-10-CM | POA: Diagnosis not present

## 2023-05-21 DIAGNOSIS — M8588 Other specified disorders of bone density and structure, other site: Secondary | ICD-10-CM | POA: Diagnosis not present

## 2023-05-21 DIAGNOSIS — M7522 Bicipital tendinitis, left shoulder: Secondary | ICD-10-CM | POA: Diagnosis not present

## 2023-05-21 DIAGNOSIS — M75112 Incomplete rotator cuff tear or rupture of left shoulder, not specified as traumatic: Secondary | ICD-10-CM | POA: Diagnosis not present

## 2023-05-21 DIAGNOSIS — M25512 Pain in left shoulder: Secondary | ICD-10-CM | POA: Diagnosis not present

## 2023-05-21 DIAGNOSIS — Z1231 Encounter for screening mammogram for malignant neoplasm of breast: Secondary | ICD-10-CM | POA: Diagnosis not present

## 2023-05-27 ENCOUNTER — Other Ambulatory Visit: Payer: Self-pay

## 2023-05-27 MED ORDER — MODAFINIL 100 MG PO TABS
100.0000 mg | ORAL_TABLET | Freq: Every day | ORAL | 0 refills | Status: DC
  Filled 2023-05-27: qty 30, 30d supply, fill #0

## 2023-05-27 NOTE — Telephone Encounter (Signed)
Refill x 1 sent Needs 6 month OV with APP please

## 2023-05-27 NOTE — Telephone Encounter (Signed)
Received a refill request for patient's modafinil 100mg  to sent to [facility] outpatient pharmacy.   Dr. Vassie Loll, please advise if you are ok with this refill. I have pended the order for you. Thanks!

## 2023-05-28 ENCOUNTER — Other Ambulatory Visit: Payer: Self-pay

## 2023-05-28 ENCOUNTER — Other Ambulatory Visit (HOSPITAL_COMMUNITY): Payer: Self-pay

## 2023-05-28 DIAGNOSIS — R7982 Elevated C-reactive protein (CRP): Secondary | ICD-10-CM | POA: Diagnosis not present

## 2023-05-28 DIAGNOSIS — E782 Mixed hyperlipidemia: Secondary | ICD-10-CM | POA: Diagnosis not present

## 2023-05-28 DIAGNOSIS — R52 Pain, unspecified: Secondary | ICD-10-CM | POA: Diagnosis not present

## 2023-05-28 DIAGNOSIS — E279 Disorder of adrenal gland, unspecified: Secondary | ICD-10-CM | POA: Diagnosis not present

## 2023-05-28 DIAGNOSIS — E7212 Methylenetetrahydrofolate reductase deficiency: Secondary | ICD-10-CM | POA: Diagnosis not present

## 2023-05-28 DIAGNOSIS — R5383 Other fatigue: Secondary | ICD-10-CM | POA: Diagnosis not present

## 2023-05-28 DIAGNOSIS — E1165 Type 2 diabetes mellitus with hyperglycemia: Secondary | ICD-10-CM | POA: Diagnosis not present

## 2023-05-28 DIAGNOSIS — E063 Autoimmune thyroiditis: Secondary | ICD-10-CM | POA: Diagnosis not present

## 2023-05-28 DIAGNOSIS — E039 Hypothyroidism, unspecified: Secondary | ICD-10-CM | POA: Diagnosis not present

## 2023-05-29 DIAGNOSIS — M25512 Pain in left shoulder: Secondary | ICD-10-CM | POA: Diagnosis not present

## 2023-05-29 DIAGNOSIS — M7522 Bicipital tendinitis, left shoulder: Secondary | ICD-10-CM | POA: Diagnosis not present

## 2023-05-29 DIAGNOSIS — M75112 Incomplete rotator cuff tear or rupture of left shoulder, not specified as traumatic: Secondary | ICD-10-CM | POA: Diagnosis not present

## 2023-05-29 DIAGNOSIS — M25612 Stiffness of left shoulder, not elsewhere classified: Secondary | ICD-10-CM | POA: Diagnosis not present

## 2023-06-02 DIAGNOSIS — M7522 Bicipital tendinitis, left shoulder: Secondary | ICD-10-CM | POA: Diagnosis not present

## 2023-06-02 DIAGNOSIS — M25512 Pain in left shoulder: Secondary | ICD-10-CM | POA: Diagnosis not present

## 2023-06-02 DIAGNOSIS — M75112 Incomplete rotator cuff tear or rupture of left shoulder, not specified as traumatic: Secondary | ICD-10-CM | POA: Diagnosis not present

## 2023-06-02 DIAGNOSIS — M25612 Stiffness of left shoulder, not elsewhere classified: Secondary | ICD-10-CM | POA: Diagnosis not present

## 2023-06-05 DIAGNOSIS — M75112 Incomplete rotator cuff tear or rupture of left shoulder, not specified as traumatic: Secondary | ICD-10-CM | POA: Diagnosis not present

## 2023-06-05 DIAGNOSIS — M25512 Pain in left shoulder: Secondary | ICD-10-CM | POA: Diagnosis not present

## 2023-06-05 DIAGNOSIS — M7522 Bicipital tendinitis, left shoulder: Secondary | ICD-10-CM | POA: Diagnosis not present

## 2023-06-05 DIAGNOSIS — M25612 Stiffness of left shoulder, not elsewhere classified: Secondary | ICD-10-CM | POA: Diagnosis not present

## 2023-06-05 DIAGNOSIS — M810 Age-related osteoporosis without current pathological fracture: Secondary | ICD-10-CM | POA: Diagnosis not present

## 2023-06-05 NOTE — Telephone Encounter (Signed)
 Can you please schedule patient Needs 6 month OV with APP please

## 2023-06-09 DIAGNOSIS — M75112 Incomplete rotator cuff tear or rupture of left shoulder, not specified as traumatic: Secondary | ICD-10-CM | POA: Diagnosis not present

## 2023-06-09 DIAGNOSIS — M25512 Pain in left shoulder: Secondary | ICD-10-CM | POA: Diagnosis not present

## 2023-06-09 DIAGNOSIS — M7522 Bicipital tendinitis, left shoulder: Secondary | ICD-10-CM | POA: Diagnosis not present

## 2023-06-09 DIAGNOSIS — M25612 Stiffness of left shoulder, not elsewhere classified: Secondary | ICD-10-CM | POA: Diagnosis not present

## 2023-06-09 LAB — LAB REPORT - SCANNED
Calcium: 10.8
EGFR: 75
Free T4: 1.52 ng/dL
PTH: 43

## 2023-06-09 NOTE — Telephone Encounter (Signed)
 Patient requests RA. Recall has been placed for when August schedule has been published

## 2023-06-10 DIAGNOSIS — E119 Type 2 diabetes mellitus without complications: Secondary | ICD-10-CM | POA: Diagnosis not present

## 2023-06-10 DIAGNOSIS — H16223 Keratoconjunctivitis sicca, not specified as Sjogren's, bilateral: Secondary | ICD-10-CM | POA: Diagnosis not present

## 2023-06-10 DIAGNOSIS — H2513 Age-related nuclear cataract, bilateral: Secondary | ICD-10-CM | POA: Diagnosis not present

## 2023-06-10 DIAGNOSIS — Z83518 Family history of other specified eye disorder: Secondary | ICD-10-CM | POA: Diagnosis not present

## 2023-06-12 DIAGNOSIS — M25512 Pain in left shoulder: Secondary | ICD-10-CM | POA: Diagnosis not present

## 2023-06-12 DIAGNOSIS — M25612 Stiffness of left shoulder, not elsewhere classified: Secondary | ICD-10-CM | POA: Diagnosis not present

## 2023-06-12 DIAGNOSIS — M75112 Incomplete rotator cuff tear or rupture of left shoulder, not specified as traumatic: Secondary | ICD-10-CM | POA: Diagnosis not present

## 2023-06-12 DIAGNOSIS — M7522 Bicipital tendinitis, left shoulder: Secondary | ICD-10-CM | POA: Diagnosis not present

## 2023-07-02 DIAGNOSIS — M25512 Pain in left shoulder: Secondary | ICD-10-CM | POA: Diagnosis not present

## 2023-07-02 DIAGNOSIS — M65342 Trigger finger, left ring finger: Secondary | ICD-10-CM | POA: Diagnosis not present

## 2023-07-02 DIAGNOSIS — M7542 Impingement syndrome of left shoulder: Secondary | ICD-10-CM | POA: Diagnosis not present

## 2023-07-08 DIAGNOSIS — M65342 Trigger finger, left ring finger: Secondary | ICD-10-CM | POA: Diagnosis not present

## 2023-07-11 DIAGNOSIS — M25512 Pain in left shoulder: Secondary | ICD-10-CM | POA: Diagnosis not present

## 2023-07-16 DIAGNOSIS — S43432A Superior glenoid labrum lesion of left shoulder, initial encounter: Secondary | ICD-10-CM | POA: Diagnosis not present

## 2023-07-16 DIAGNOSIS — M19012 Primary osteoarthritis, left shoulder: Secondary | ICD-10-CM | POA: Diagnosis not present

## 2023-07-16 DIAGNOSIS — M7542 Impingement syndrome of left shoulder: Secondary | ICD-10-CM | POA: Diagnosis not present

## 2023-07-17 ENCOUNTER — Other Ambulatory Visit (HOSPITAL_COMMUNITY): Payer: Self-pay

## 2023-07-17 DIAGNOSIS — M65342 Trigger finger, left ring finger: Secondary | ICD-10-CM | POA: Diagnosis not present

## 2023-07-17 MED ORDER — HYDROCODONE-ACETAMINOPHEN 5-325 MG PO TABS
1.0000 | ORAL_TABLET | Freq: Four times a day (QID) | ORAL | 0 refills | Status: DC
  Filled 2023-07-17: qty 5, 2d supply, fill #0

## 2023-07-20 ENCOUNTER — Other Ambulatory Visit (HOSPITAL_COMMUNITY): Payer: Self-pay

## 2023-07-29 ENCOUNTER — Other Ambulatory Visit (HOSPITAL_COMMUNITY): Payer: Self-pay

## 2023-07-29 ENCOUNTER — Other Ambulatory Visit: Payer: Self-pay

## 2023-07-29 ENCOUNTER — Other Ambulatory Visit: Payer: Self-pay | Admitting: Pulmonary Disease

## 2023-07-29 MED ORDER — MODAFINIL 100 MG PO TABS
100.0000 mg | ORAL_TABLET | Freq: Every day | ORAL | 0 refills | Status: DC
  Filled 2023-07-29: qty 30, 30d supply, fill #0

## 2023-07-29 NOTE — Telephone Encounter (Signed)
**Note De-identified  [place] Obfuscation** Please advise 

## 2023-08-05 DIAGNOSIS — R5383 Other fatigue: Secondary | ICD-10-CM | POA: Diagnosis not present

## 2023-08-05 DIAGNOSIS — D6489 Other specified anemias: Secondary | ICD-10-CM | POA: Diagnosis not present

## 2023-08-05 DIAGNOSIS — R946 Abnormal results of thyroid function studies: Secondary | ICD-10-CM | POA: Diagnosis not present

## 2023-08-05 DIAGNOSIS — E1165 Type 2 diabetes mellitus with hyperglycemia: Secondary | ICD-10-CM | POA: Diagnosis not present

## 2023-08-05 DIAGNOSIS — R52 Pain, unspecified: Secondary | ICD-10-CM | POA: Diagnosis not present

## 2023-08-05 DIAGNOSIS — E039 Hypothyroidism, unspecified: Secondary | ICD-10-CM | POA: Diagnosis not present

## 2023-08-05 DIAGNOSIS — E782 Mixed hyperlipidemia: Secondary | ICD-10-CM | POA: Diagnosis not present

## 2023-08-05 DIAGNOSIS — E063 Autoimmune thyroiditis: Secondary | ICD-10-CM | POA: Diagnosis not present

## 2023-08-05 DIAGNOSIS — R799 Abnormal finding of blood chemistry, unspecified: Secondary | ICD-10-CM | POA: Diagnosis not present

## 2023-08-10 DIAGNOSIS — S80861A Insect bite (nonvenomous), right lower leg, initial encounter: Secondary | ICD-10-CM | POA: Diagnosis not present

## 2023-08-14 LAB — LAB REPORT - SCANNED
A1c: 6.7
EGFR: 73
Free T4: 1.51 ng/dL
TSH: 2.42 (ref 0.41–5.90)

## 2023-08-25 DIAGNOSIS — M810 Age-related osteoporosis without current pathological fracture: Secondary | ICD-10-CM | POA: Diagnosis not present

## 2023-08-25 DIAGNOSIS — E119 Type 2 diabetes mellitus without complications: Secondary | ICD-10-CM | POA: Diagnosis not present

## 2023-08-25 DIAGNOSIS — E039 Hypothyroidism, unspecified: Secondary | ICD-10-CM | POA: Diagnosis not present

## 2023-08-25 DIAGNOSIS — G629 Polyneuropathy, unspecified: Secondary | ICD-10-CM | POA: Diagnosis not present

## 2023-08-26 LAB — BMP8+EGFR
Calcium: 10.3
EGFR: 70

## 2023-08-26 LAB — PTH, INTACT: PTH: 38

## 2023-08-28 LAB — CREATININE, URINE, 24 HOUR: Creatinine, POC: 33 mg/dL

## 2023-09-23 DIAGNOSIS — M75102 Unspecified rotator cuff tear or rupture of left shoulder, not specified as traumatic: Secondary | ICD-10-CM | POA: Diagnosis not present

## 2023-09-23 DIAGNOSIS — M25612 Stiffness of left shoulder, not elsewhere classified: Secondary | ICD-10-CM | POA: Diagnosis not present

## 2023-09-23 DIAGNOSIS — M25512 Pain in left shoulder: Secondary | ICD-10-CM | POA: Diagnosis not present

## 2023-09-23 DIAGNOSIS — M75112 Incomplete rotator cuff tear or rupture of left shoulder, not specified as traumatic: Secondary | ICD-10-CM | POA: Diagnosis not present

## 2023-09-25 DIAGNOSIS — M25512 Pain in left shoulder: Secondary | ICD-10-CM | POA: Diagnosis not present

## 2023-09-29 DIAGNOSIS — M25512 Pain in left shoulder: Secondary | ICD-10-CM | POA: Diagnosis not present

## 2023-09-30 ENCOUNTER — Other Ambulatory Visit (HOSPITAL_COMMUNITY): Payer: Self-pay

## 2023-09-30 ENCOUNTER — Other Ambulatory Visit: Payer: Self-pay | Admitting: Pulmonary Disease

## 2023-09-30 MED ORDER — MODAFINIL 100 MG PO TABS
100.0000 mg | ORAL_TABLET | Freq: Every day | ORAL | 0 refills | Status: DC
Start: 2023-09-30 — End: 2023-12-09
  Filled 2023-09-30: qty 30, 30d supply, fill #0

## 2023-09-30 NOTE — Telephone Encounter (Signed)
 Please advise on Modanafil refill

## 2023-10-01 DIAGNOSIS — M25512 Pain in left shoulder: Secondary | ICD-10-CM | POA: Diagnosis not present

## 2023-10-05 ENCOUNTER — Other Ambulatory Visit (HOSPITAL_COMMUNITY): Payer: Self-pay

## 2023-10-05 DIAGNOSIS — M7542 Impingement syndrome of left shoulder: Secondary | ICD-10-CM | POA: Diagnosis not present

## 2023-10-05 DIAGNOSIS — S46112A Strain of muscle, fascia and tendon of long head of biceps, left arm, initial encounter: Secondary | ICD-10-CM | POA: Diagnosis not present

## 2023-10-05 DIAGNOSIS — M7522 Bicipital tendinitis, left shoulder: Secondary | ICD-10-CM | POA: Diagnosis not present

## 2023-10-05 DIAGNOSIS — S43432A Superior glenoid labrum lesion of left shoulder, initial encounter: Secondary | ICD-10-CM | POA: Diagnosis not present

## 2023-10-05 DIAGNOSIS — S42112A Displaced fracture of body of scapula, left shoulder, initial encounter for closed fracture: Secondary | ICD-10-CM | POA: Diagnosis not present

## 2023-10-05 DIAGNOSIS — M19012 Primary osteoarthritis, left shoulder: Secondary | ICD-10-CM | POA: Diagnosis not present

## 2023-10-05 DIAGNOSIS — G8918 Other acute postprocedural pain: Secondary | ICD-10-CM | POA: Diagnosis not present

## 2023-10-05 DIAGNOSIS — M25712 Osteophyte, left shoulder: Secondary | ICD-10-CM | POA: Diagnosis not present

## 2023-10-05 DIAGNOSIS — M75122 Complete rotator cuff tear or rupture of left shoulder, not specified as traumatic: Secondary | ICD-10-CM | POA: Diagnosis not present

## 2023-10-05 MED ORDER — HYDROCODONE-ACETAMINOPHEN 5-325 MG PO TABS
1.0000 | ORAL_TABLET | ORAL | 0 refills | Status: DC
  Filled 2023-10-05: qty 35, 6d supply, fill #0

## 2023-10-05 MED ORDER — METHOCARBAMOL 500 MG PO TABS
ORAL_TABLET | ORAL | 1 refills | Status: DC
  Filled 2023-10-05: qty 40, 10d supply, fill #0

## 2023-10-05 MED ORDER — ONDANSETRON HCL 4 MG PO TABS
4.0000 mg | ORAL_TABLET | Freq: Four times a day (QID) | ORAL | 0 refills | Status: DC
  Filled 2023-10-05: qty 30, 8d supply, fill #0

## 2023-10-06 ENCOUNTER — Other Ambulatory Visit (HOSPITAL_COMMUNITY): Payer: Self-pay

## 2023-10-08 DIAGNOSIS — S46002D Unspecified injury of muscle(s) and tendon(s) of the rotator cuff of left shoulder, subsequent encounter: Secondary | ICD-10-CM | POA: Diagnosis not present

## 2023-10-08 DIAGNOSIS — S46012A Strain of muscle(s) and tendon(s) of the rotator cuff of left shoulder, initial encounter: Secondary | ICD-10-CM | POA: Diagnosis not present

## 2023-10-12 DIAGNOSIS — S46002D Unspecified injury of muscle(s) and tendon(s) of the rotator cuff of left shoulder, subsequent encounter: Secondary | ICD-10-CM | POA: Diagnosis not present

## 2023-10-12 DIAGNOSIS — S46012A Strain of muscle(s) and tendon(s) of the rotator cuff of left shoulder, initial encounter: Secondary | ICD-10-CM | POA: Diagnosis not present

## 2023-10-14 DIAGNOSIS — S46012A Strain of muscle(s) and tendon(s) of the rotator cuff of left shoulder, initial encounter: Secondary | ICD-10-CM | POA: Diagnosis not present

## 2023-10-14 DIAGNOSIS — S46002D Unspecified injury of muscle(s) and tendon(s) of the rotator cuff of left shoulder, subsequent encounter: Secondary | ICD-10-CM | POA: Diagnosis not present

## 2023-10-16 DIAGNOSIS — S46002D Unspecified injury of muscle(s) and tendon(s) of the rotator cuff of left shoulder, subsequent encounter: Secondary | ICD-10-CM | POA: Diagnosis not present

## 2023-10-16 DIAGNOSIS — S46012A Strain of muscle(s) and tendon(s) of the rotator cuff of left shoulder, initial encounter: Secondary | ICD-10-CM | POA: Diagnosis not present

## 2023-10-19 DIAGNOSIS — S46012A Strain of muscle(s) and tendon(s) of the rotator cuff of left shoulder, initial encounter: Secondary | ICD-10-CM | POA: Diagnosis not present

## 2023-10-19 DIAGNOSIS — S46002D Unspecified injury of muscle(s) and tendon(s) of the rotator cuff of left shoulder, subsequent encounter: Secondary | ICD-10-CM | POA: Diagnosis not present

## 2023-10-21 DIAGNOSIS — S46012A Strain of muscle(s) and tendon(s) of the rotator cuff of left shoulder, initial encounter: Secondary | ICD-10-CM | POA: Diagnosis not present

## 2023-10-21 DIAGNOSIS — S46002D Unspecified injury of muscle(s) and tendon(s) of the rotator cuff of left shoulder, subsequent encounter: Secondary | ICD-10-CM | POA: Diagnosis not present

## 2023-10-23 DIAGNOSIS — S46012A Strain of muscle(s) and tendon(s) of the rotator cuff of left shoulder, initial encounter: Secondary | ICD-10-CM | POA: Diagnosis not present

## 2023-10-23 DIAGNOSIS — S46002D Unspecified injury of muscle(s) and tendon(s) of the rotator cuff of left shoulder, subsequent encounter: Secondary | ICD-10-CM | POA: Diagnosis not present

## 2023-10-27 DIAGNOSIS — S46002D Unspecified injury of muscle(s) and tendon(s) of the rotator cuff of left shoulder, subsequent encounter: Secondary | ICD-10-CM | POA: Diagnosis not present

## 2023-10-27 DIAGNOSIS — S46012A Strain of muscle(s) and tendon(s) of the rotator cuff of left shoulder, initial encounter: Secondary | ICD-10-CM | POA: Diagnosis not present

## 2023-10-28 ENCOUNTER — Other Ambulatory Visit (HOSPITAL_COMMUNITY): Payer: Self-pay

## 2023-10-28 MED ORDER — SYNTHROID 75 MCG PO TABS
75.0000 ug | ORAL_TABLET | Freq: Every morning | ORAL | 5 refills | Status: DC
  Filled 2023-10-28: qty 30, 30d supply, fill #0

## 2023-10-29 ENCOUNTER — Other Ambulatory Visit (HOSPITAL_COMMUNITY): Payer: Self-pay

## 2023-10-29 DIAGNOSIS — S46012A Strain of muscle(s) and tendon(s) of the rotator cuff of left shoulder, initial encounter: Secondary | ICD-10-CM | POA: Diagnosis not present

## 2023-10-29 DIAGNOSIS — S46002D Unspecified injury of muscle(s) and tendon(s) of the rotator cuff of left shoulder, subsequent encounter: Secondary | ICD-10-CM | POA: Diagnosis not present

## 2023-10-29 MED ORDER — SYNTHROID 75 MCG PO TABS
75.0000 ug | ORAL_TABLET | Freq: Every morning | ORAL | 5 refills | Status: DC
  Filled 2023-10-29: qty 30, 30d supply, fill #0

## 2023-10-29 MED ORDER — SYNTHROID 75 MCG PO TABS
75.0000 ug | ORAL_TABLET | ORAL | 11 refills | Status: DC
  Filled 2023-10-29: qty 90, 90d supply, fill #0

## 2023-10-30 ENCOUNTER — Other Ambulatory Visit (HOSPITAL_COMMUNITY): Payer: Self-pay

## 2023-10-30 MED ORDER — SYNTHROID 75 MCG PO TABS
75.0000 ug | ORAL_TABLET | Freq: Every day | ORAL | 11 refills | Status: AC
  Filled 2023-10-30 – 2024-01-25 (×2): qty 90, 90d supply, fill #0
  Filled 2024-04-19: qty 30, 30d supply, fill #1

## 2023-11-02 ENCOUNTER — Other Ambulatory Visit (HOSPITAL_COMMUNITY): Payer: Self-pay

## 2023-11-03 DIAGNOSIS — S46002D Unspecified injury of muscle(s) and tendon(s) of the rotator cuff of left shoulder, subsequent encounter: Secondary | ICD-10-CM | POA: Diagnosis not present

## 2023-11-03 DIAGNOSIS — S46012A Strain of muscle(s) and tendon(s) of the rotator cuff of left shoulder, initial encounter: Secondary | ICD-10-CM | POA: Diagnosis not present

## 2023-11-06 DIAGNOSIS — S46002D Unspecified injury of muscle(s) and tendon(s) of the rotator cuff of left shoulder, subsequent encounter: Secondary | ICD-10-CM | POA: Diagnosis not present

## 2023-11-06 DIAGNOSIS — S46012A Strain of muscle(s) and tendon(s) of the rotator cuff of left shoulder, initial encounter: Secondary | ICD-10-CM | POA: Diagnosis not present

## 2023-11-10 DIAGNOSIS — S46012A Strain of muscle(s) and tendon(s) of the rotator cuff of left shoulder, initial encounter: Secondary | ICD-10-CM | POA: Diagnosis not present

## 2023-11-10 DIAGNOSIS — S46002D Unspecified injury of muscle(s) and tendon(s) of the rotator cuff of left shoulder, subsequent encounter: Secondary | ICD-10-CM | POA: Diagnosis not present

## 2023-11-12 DIAGNOSIS — S46002D Unspecified injury of muscle(s) and tendon(s) of the rotator cuff of left shoulder, subsequent encounter: Secondary | ICD-10-CM | POA: Diagnosis not present

## 2023-11-12 DIAGNOSIS — S46012A Strain of muscle(s) and tendon(s) of the rotator cuff of left shoulder, initial encounter: Secondary | ICD-10-CM | POA: Diagnosis not present

## 2023-11-17 DIAGNOSIS — S46012A Strain of muscle(s) and tendon(s) of the rotator cuff of left shoulder, initial encounter: Secondary | ICD-10-CM | POA: Diagnosis not present

## 2023-11-17 DIAGNOSIS — S46002D Unspecified injury of muscle(s) and tendon(s) of the rotator cuff of left shoulder, subsequent encounter: Secondary | ICD-10-CM | POA: Diagnosis not present

## 2023-11-20 DIAGNOSIS — S46002D Unspecified injury of muscle(s) and tendon(s) of the rotator cuff of left shoulder, subsequent encounter: Secondary | ICD-10-CM | POA: Diagnosis not present

## 2023-11-20 DIAGNOSIS — S46012A Strain of muscle(s) and tendon(s) of the rotator cuff of left shoulder, initial encounter: Secondary | ICD-10-CM | POA: Diagnosis not present

## 2023-11-26 DIAGNOSIS — S46012A Strain of muscle(s) and tendon(s) of the rotator cuff of left shoulder, initial encounter: Secondary | ICD-10-CM | POA: Diagnosis not present

## 2023-11-26 DIAGNOSIS — S46002D Unspecified injury of muscle(s) and tendon(s) of the rotator cuff of left shoulder, subsequent encounter: Secondary | ICD-10-CM | POA: Diagnosis not present

## 2023-12-09 ENCOUNTER — Other Ambulatory Visit: Payer: Self-pay | Admitting: Nurse Practitioner

## 2023-12-09 NOTE — Telephone Encounter (Signed)
**Note De-identified  [place] Obfuscation** Please advise 

## 2023-12-09 NOTE — Telephone Encounter (Signed)
 Please route to Dr. Jude. I have never seen pt; refilled previously due to being on call provider. Thanks.

## 2023-12-10 ENCOUNTER — Other Ambulatory Visit (HOSPITAL_COMMUNITY): Payer: Self-pay

## 2023-12-10 MED ORDER — MODAFINIL 100 MG PO TABS
100.0000 mg | ORAL_TABLET | Freq: Every day | ORAL | 0 refills | Status: DC
  Filled 2023-12-10: qty 30, 30d supply, fill #0

## 2023-12-10 NOTE — Telephone Encounter (Signed)
 Needs 1 year Fu appt with me/APP Refill x1 sent

## 2023-12-15 ENCOUNTER — Encounter (HOSPITAL_BASED_OUTPATIENT_CLINIC_OR_DEPARTMENT_OTHER): Payer: Self-pay | Admitting: Emergency Medicine

## 2023-12-15 ENCOUNTER — Other Ambulatory Visit: Payer: Self-pay

## 2023-12-15 ENCOUNTER — Emergency Department (HOSPITAL_BASED_OUTPATIENT_CLINIC_OR_DEPARTMENT_OTHER)

## 2023-12-15 ENCOUNTER — Emergency Department (HOSPITAL_BASED_OUTPATIENT_CLINIC_OR_DEPARTMENT_OTHER)
Admission: EM | Admit: 2023-12-15 | Discharge: 2023-12-15 | Disposition: A | Discharge status: Home / Self Care | Attending: Emergency Medicine | Admitting: Emergency Medicine

## 2023-12-15 ENCOUNTER — Other Ambulatory Visit (HOSPITAL_COMMUNITY): Payer: Self-pay

## 2023-12-15 DIAGNOSIS — N200 Calculus of kidney: Secondary | ICD-10-CM | POA: Diagnosis not present

## 2023-12-15 DIAGNOSIS — Z853 Personal history of malignant neoplasm of breast: Secondary | ICD-10-CM | POA: Insufficient documentation

## 2023-12-15 DIAGNOSIS — R1031 Right lower quadrant pain: Secondary | ICD-10-CM | POA: Diagnosis present

## 2023-12-15 DIAGNOSIS — N281 Cyst of kidney, acquired: Secondary | ICD-10-CM | POA: Diagnosis not present

## 2023-12-15 DIAGNOSIS — N390 Urinary tract infection, site not specified: Secondary | ICD-10-CM

## 2023-12-15 DIAGNOSIS — N134 Hydroureter: Secondary | ICD-10-CM | POA: Diagnosis not present

## 2023-12-15 DIAGNOSIS — N132 Hydronephrosis with renal and ureteral calculous obstruction: Secondary | ICD-10-CM | POA: Insufficient documentation

## 2023-12-15 LAB — CBC WITH DIFFERENTIAL/PLATELET
Abs Immature Granulocytes: 0.02 K/uL (ref 0.00–0.07)
Basophils Absolute: 0.1 K/uL (ref 0.0–0.1)
Basophils Relative: 1 %
Eosinophils Absolute: 0.2 K/uL (ref 0.0–0.5)
Eosinophils Relative: 2 %
HCT: 37.3 % (ref 36.0–46.0)
Hemoglobin: 12.8 g/dL (ref 12.0–15.0)
Immature Granulocytes: 0 %
Lymphocytes Relative: 17 %
Lymphs Abs: 1.5 K/uL (ref 0.7–4.0)
MCH: 30.4 pg (ref 26.0–34.0)
MCHC: 34.3 g/dL (ref 30.0–36.0)
MCV: 88.6 fL (ref 80.0–100.0)
Monocytes Absolute: 0.7 K/uL (ref 0.1–1.0)
Monocytes Relative: 9 %
Neutro Abs: 6.2 K/uL (ref 1.7–7.7)
Neutrophils Relative %: 71 %
Platelets: 216 K/uL (ref 150–400)
RBC: 4.21 MIL/uL (ref 3.87–5.11)
RDW: 12.5 % (ref 11.5–15.5)
WBC: 8.6 K/uL (ref 4.0–10.5)
nRBC: 0 % (ref 0.0–0.2)

## 2023-12-15 LAB — URINALYSIS, ROUTINE W REFLEX MICROSCOPIC
Bilirubin Urine: NEGATIVE
Glucose, UA: NEGATIVE mg/dL
Ketones, ur: NEGATIVE mg/dL
Nitrite: NEGATIVE
Protein, ur: 30 mg/dL — AB
RBC / HPF: >50 RBC/hpf (ref 0–5)
Specific Gravity, Urine: 1.01 (ref 1.005–1.030)
WBC, UA: >50 WBC/hpf (ref 0–5)
pH: 7 (ref 5.0–8.0)

## 2023-12-15 LAB — COMPREHENSIVE METABOLIC PANEL WITH GFR
ALT: 21 U/L (ref 0–44)
AST: 26 U/L (ref 15–41)
Albumin: 4.4 g/dL (ref 3.5–5.0)
Alkaline Phosphatase: 111 U/L (ref 38–126)
Anion gap: 12 (ref 5–15)
BUN: 20 mg/dL (ref 8–23)
CO2: 23 mmol/L (ref 22–32)
Calcium: 11.4 mg/dL — ABNORMAL HIGH (ref 8.9–10.3)
Chloride: 102 mmol/L (ref 98–111)
Creatinine, Ser: 0.87 mg/dL (ref 0.44–1.00)
GFR, Estimated: >60 mL/min (ref >60)
Glucose, Bld: 186 mg/dL — ABNORMAL HIGH (ref 70–99)
Potassium: 4 mmol/L (ref 3.5–5.1)
Sodium: 137 mmol/L (ref 135–145)
Total Bilirubin: 0.2 mg/dL (ref 0.0–1.2)
Total Protein: 6.8 g/dL (ref 6.5–8.1)

## 2023-12-15 LAB — LIPASE, BLOOD: Lipase: 71 U/L — ABNORMAL HIGH (ref 11–51)

## 2023-12-15 MED ORDER — MORPHINE SULFATE (PF) 4 MG/ML IV SOLN: 4.0000 mg | Freq: Once | INTRAVENOUS | Status: DC

## 2023-12-15 MED ORDER — KETOROLAC TROMETHAMINE 30 MG/ML IJ SOLN: 30.0000 mg | Freq: Once | INTRAMUSCULAR | Status: DC

## 2023-12-15 MED ORDER — ONDANSETRON HCL 4 MG/2ML IJ SOLN
4.0000 mg | Freq: Once | INTRAMUSCULAR | Status: AC
  Administered 2023-12-15: 4 mg via INTRAVENOUS
  Filled 2023-12-15: qty 2

## 2023-12-15 MED ORDER — OXYCODONE-ACETAMINOPHEN 5-325 MG PO TABS
1.0000 | ORAL_TABLET | Freq: Four times a day (QID) | ORAL | 0 refills | Status: DC | PRN
  Filled 2023-12-15: qty 20, 3d supply, fill #0

## 2023-12-15 MED ORDER — ONDANSETRON HCL 4 MG/2ML IJ SOLN: 4.0000 mg | Freq: Once | INTRAMUSCULAR | Status: DC

## 2023-12-15 MED ORDER — SODIUM CHLORIDE 0.9 % IV BOLUS
1000.0000 mL | Freq: Once | INTRAVENOUS | Status: AC
  Administered 2023-12-15: 1000 mL via INTRAVENOUS

## 2023-12-15 MED ORDER — SODIUM CHLORIDE 0.9 % IV SOLN
1.0000 g | Freq: Once | INTRAVENOUS | Status: AC
  Administered 2023-12-15: 1 g via INTRAVENOUS
  Filled 2023-12-15: qty 10

## 2023-12-15 MED ORDER — CEFDINIR 300 MG PO CAPS
300.0000 mg | ORAL_CAPSULE | Freq: Two times a day (BID) | ORAL | 0 refills | Status: DC
  Filled 2023-12-15: qty 15, 8d supply, fill #0

## 2023-12-15 MED ORDER — MORPHINE SULFATE (PF) 4 MG/ML IV SOLN
4.0000 mg | Freq: Once | INTRAVENOUS | Status: AC
  Administered 2023-12-15: 4 mg via INTRAVENOUS
  Filled 2023-12-15: qty 1

## 2023-12-15 MED ORDER — KETOROLAC TROMETHAMINE 30 MG/ML IJ SOLN
30.0000 mg | Freq: Once | INTRAMUSCULAR | Status: AC
  Administered 2023-12-15: 30 mg via INTRAVENOUS
  Filled 2023-12-15: qty 1

## 2023-12-15 MED ORDER — SODIUM CHLORIDE 0.9 % IV BOLUS: 1000.0000 mL | Freq: Once | INTRAVENOUS | Status: DC

## 2023-12-15 NOTE — Discharge Instructions (Signed)
 Begin taking cefdinir  as prescribed.  Begin taking Percocet as prescribed as needed for pain.  You are to follow-up with urology in the next 1 to 2 days.  The contact information for [facility] has been provided in this discharge summary for you to call and make these arrangements.  Return to the ER if you develop worsening pain, high fevers, or for other new and concerning symptoms.

## 2023-12-15 NOTE — ED Provider Notes (Signed)
  EMERGENCY DEPARTMENT AT [facility] Provider Note   CSN: [id number] Arrival date & time: 12/15/23  9743     Patient presents with: Abdominal Pain and Back Pain   Charlotte [name] is a 73 y.o. female.   Patient is a 73 year old female with history of thyroidectomy, hyperlipidemia, GERD, breast cancer.  Patient presenting today with complaints of right lower quadrant pain.  Symptoms began 2 mornings ago and seem to improve after having a bowel movement.  Symptoms returned yesterday and have worsened into this evening.  She describes a constant pain to the right lower quadrant along with dark urine.  She denies any dysuria.  No fevers or chills.  No bowel complaints.       Prior to Admission medications   Medication Sig Start Date End Date Taking? Authorizing Provider  cycloSPORINE  (RESTASIS ) 0.05 % ophthalmic emulsion Place 1 drop into both eyes 2 (two) times daily. 04/29/23   Marcey Elspeth PARAS, MD  HYDROcodone -acetaminophen  (NORCO/VICODIN) 5-325 MG tablet Take 1 tablet by mouth every 6 (six) hours. 07/17/23     HYDROcodone -acetaminophen  (NORCO/VICODIN) 5-325 MG tablet Take 1 tablet by mouth every 4 - 6 hours as needed for pain 10/05/23     levothyroxine  (SYNTHROID , LEVOTHROID) 88 MCG tablet Take 75 mcg by mouth daily before breakfast. Reported on 04/27/2015    [provider]  Lifitegrast  (XIIDRA ) 5 % SOLN Place 1 drop into both eyes 2 (two) times daily. 03/26/23     Lifitegrast  (XIIDRA ) 5 % SOLN Place 1 drop into both eyes 2 (two) times daily. 03/26/23     Lifitegrast  (XIIDRA ) 5 % SOLN Place 1 drop into both eyes 2 (two) times daily. 03/26/23     methocarbamol  (ROBAXIN ) 500 MG tablet Take 1 tablet by mouth every 6 - 8 hours as needed for spasm 10/05/23     modafinil  (PROVIGIL ) 100 MG tablet Take 1 tablet (100 mg total) by mouth daily. 12/10/23   Jude Harden GAILS, MD  Multiple Vitamins-Minerals (MULTIVITAMIN WITH MINERALS) tablet Take 1 tablet by mouth daily.    [provider]  OMEGA-3 KRILL OIL PO Take 350 mg by mouth daily.    [provider]  ondansetron  (ZOFRAN ) 4 MG tablet Take 1 tablet by mouth every 6 - 8 hours as needed, for nausea or vomiting. 10/05/23     pravastatin  (PRAVACHOL ) 20 MG tablet Take 20 mg by mouth daily.    [provider]  pravastatin  (PRAVACHOL ) 20 MG tablet Take 1 tablet (20 mg total) by mouth daily. 04/22/22     SYNTHROID  75 MCG tablet Take 1 tablet by mouth daily in the morning on an empty stomach 10/28/23     SYNTHROID  75 MCG tablet Take 1 tablet (75 mcg total) by mouth in the morning on an empty stomach. 10/28/23     SYNTHROID  75 MCG tablet Take 1 tablet (75 mcg total) by mouth every morning on an empty stomach 10/29/23     SYNTHROID  75 MCG tablet Take 1 tablet (75 mcg total) by mouth daily in the morning on an empty stomach 10/30/23       Allergies: Gabapentin  and Protonix [pantoprazole sodium]    Review of Systems  All other systems reviewed and are negative.   Updated Vital Signs BP (!) 154/77 (BP Location: Left Arm)   Pulse (!) 58   Temp 98.4 F (36.9 C) (Oral)   Resp 18   Ht 5' 3 (1.6 m)   Wt 58.1 kg  SpO2 97%   BMI 22.67 kg/m   Physical Exam Vitals and nursing note reviewed.  Constitutional:      General: She is not in acute distress.    Appearance: She is well-developed. She is not diaphoretic.  HENT:     Head: Normocephalic and atraumatic.  Cardiovascular:     Rate and Rhythm: Normal rate and regular rhythm.     Heart sounds: No murmur heard.    No friction rub. No gallop.  Pulmonary:     Effort: Pulmonary effort is normal. No respiratory distress.     Breath sounds: Normal breath sounds. No wheezing.  Abdominal:     General: Bowel sounds are normal. There is no distension.     Palpations: Abdomen is soft.     Tenderness: There is abdominal tenderness in the right lower quadrant. There is no right CVA tenderness, left CVA tenderness, guarding or rebound.  Musculoskeletal:         General: Normal range of motion.     Cervical back: Normal range of motion and neck supple.  Skin:    General: Skin is warm and dry.  Neurological:     General: No focal deficit present.     Mental Status: She is alert and oriented to person, place, and time.     (all labs ordered are listed, but only abnormal results are displayed) Labs Reviewed  URINALYSIS, ROUTINE W REFLEX MICROSCOPIC - Abnormal; Notable for the following components:      Result Value   APPearance HAZY (*)    Hgb urine dipstick LARGE (*)    Protein, ur 30 (*)    Leukocytes,Ua LARGE (*)    Bacteria, UA FEW (*)    All other components within normal limits  CBC WITH DIFFERENTIAL/PLATELET  LIPASE, BLOOD  CBC WITH DIFFERENTIAL/PLATELET  COMPREHENSIVE METABOLIC PANEL WITH GFR    EKG: None  Radiology: No results found.   Procedures   Medications Ordered in the ED  sodium chloride  0.9 % bolus 1,000 mL (has no administration in time range)  ondansetron  (ZOFRAN ) injection 4 mg (has no administration in time range)  ketorolac  (TORADOL ) 30 MG/ML injection 30 mg (has no administration in time range)  morphine  (PF) 4 MG/ML injection 4 mg (has no administration in time range)                                    Medical Decision Making Amount and/or Complexity of Data Reviewed Labs: ordered. Radiology: ordered.  Risk Prescription drug management.   Patient is a 73 year old female presenting with right flank and abdominal pain as described in the HPI.  She arrives here with stable vital signs and is afebrile.  Physical examination reveals some right-sided CVA tenderness, but is otherwise unremarkable.  Laboratory studies obtained including CBC, CMP, and lipase.  Her lipase is mildly elevated at 71, but remainder of laboratory studies are unremarkable.  Urinalysis does show large leukocytes and greater than 50 white cells, but is nitrite negative.  CT scan with renal protocol obtained showing  right-sided acute obstructive uropathy due to tandem right ureteral stones in the upper pelvis with the larger measuring 11 mm.  Patient has been treated with Toradol  and morphine  for pain along with Zofran  for nausea and is feeling markedly improved.  I have discussed the CT findings and urinalysis results with Dr. Mitchell who is on-call with urology.  She is recommending discharge  with cefdinir  and prompt follow-up with urology.     Final diagnoses:  None    ED Discharge Orders     None          Geroldine Berg, MD 12/15/23 [phone]

## 2023-12-15 NOTE — ED Triage Notes (Signed)
  Patient comes in with abdominal pain that radiates to lower back.  Patient states pain started Sunday morning.  Also endorses dark colored urine and dysuria.  Afebrile at home.  Has had some N/V.  Pain 8/10, squeezing.

## 2023-12-15 NOTE — ED Provider Notes (Signed)
 [place] EMERGENCY DEPARTMENT AT [facility] Provider Note   CSN: [id number] Arrival date & time: 12/15/23  9743     Patient presents with: Abdominal Pain and Back Pain   Charlotte [name] is a 73 y.o. female.   Patient is a 73 year old female with history of prior cholecystectomy, hypothyroidism, hyperlipidemia, fibromyalgia.  Patient presenting today with complaints of right lower quadrant pain.  Symptoms started earlier this this evening.  She describes a constant pain to the right lower quadrant with no bowel or bladder complaints.  She denies any fevers or chills.  Pain worse with movement and palpation.  No alleviating factors.       Prior to Admission medications   Medication Sig Start Date End Date Taking? Authorizing Provider  cycloSPORINE  (RESTASIS ) 0.05 % ophthalmic emulsion Place 1 drop into both eyes 2 (two) times daily. 04/29/23   Marcey Elspeth PARAS, MD  HYDROcodone -acetaminophen  (NORCO/VICODIN) 5-325 MG tablet Take 1 tablet by mouth every 6 (six) hours. 07/17/23     HYDROcodone -acetaminophen  (NORCO/VICODIN) 5-325 MG tablet Take 1 tablet by mouth every 4 - 6 hours as needed for pain 10/05/23     levothyroxine  (SYNTHROID , LEVOTHROID) 88 MCG tablet Take 75 mcg by mouth daily before breakfast. Reported on 04/27/2015    [provider]  Lifitegrast  (XIIDRA ) 5 % SOLN Place 1 drop into both eyes 2 (two) times daily. 03/26/23     Lifitegrast  (XIIDRA ) 5 % SOLN Place 1 drop into both eyes 2 (two) times daily. 03/26/23     Lifitegrast  (XIIDRA ) 5 % SOLN Place 1 drop into both eyes 2 (two) times daily. 03/26/23     methocarbamol  (ROBAXIN ) 500 MG tablet Take 1 tablet by mouth every 6 - 8 hours as needed for spasm 10/05/23     modafinil  (PROVIGIL ) 100 MG tablet Take 1 tablet (100 mg total) by mouth daily. 12/10/23   Jude Harden GAILS, MD  Multiple Vitamins-Minerals (MULTIVITAMIN WITH MINERALS) tablet Take 1 tablet by mouth daily.    [provider]  OMEGA-3 KRILL OIL PO  Take 350 mg by mouth daily.    [provider]  ondansetron  (ZOFRAN ) 4 MG tablet Take 1 tablet by mouth every 6 - 8 hours as needed, for nausea or vomiting. 10/05/23     pravastatin  (PRAVACHOL ) 20 MG tablet Take 20 mg by mouth daily.    [provider]  pravastatin  (PRAVACHOL ) 20 MG tablet Take 1 tablet (20 mg total) by mouth daily. 04/22/22     SYNTHROID  75 MCG tablet Take 1 tablet by mouth daily in the morning on an empty stomach 10/28/23     SYNTHROID  75 MCG tablet Take 1 tablet (75 mcg total) by mouth in the morning on an empty stomach. 10/28/23     SYNTHROID  75 MCG tablet Take 1 tablet (75 mcg total) by mouth every morning on an empty stomach 10/29/23     SYNTHROID  75 MCG tablet Take 1 tablet (75 mcg total) by mouth daily in the morning on an empty stomach 10/30/23       Allergies: Gabapentin  and Protonix [pantoprazole sodium]    Review of Systems  All other systems reviewed and are negative.   Updated Vital Signs BP (!) 154/77 (BP Location: Left Arm)   Pulse (!) 58   Temp 98.4 F (36.9 C) (Oral)   Resp 18   Ht 5' 3 (1.6 m)   Wt 58.1 kg   SpO2 97%   BMI 22.67 kg/m   Physical Exam Vitals  and nursing note reviewed.  Constitutional:      General: She is not in acute distress.    Appearance: She is well-developed. She is not diaphoretic.  HENT:     Head: Normocephalic and atraumatic.  Cardiovascular:     Rate and Rhythm: Normal rate and regular rhythm.     Heart sounds: No murmur heard.    No friction rub. No gallop.  Pulmonary:     Effort: Pulmonary effort is normal. No respiratory distress.     Breath sounds: Normal breath sounds. No wheezing.  Abdominal:     General: Bowel sounds are normal. There is no distension.     Palpations: Abdomen is soft.     Tenderness: There is abdominal tenderness in the right lower quadrant. There is no right CVA tenderness, left CVA tenderness, guarding or rebound.  Musculoskeletal:        General: Normal range of motion.      Cervical back: Normal range of motion and neck supple.  Skin:    General: Skin is warm and dry.  Neurological:     General: No focal deficit present.     Mental Status: She is alert and oriented to person, place, and time.     (all labs ordered are listed, but only abnormal results are displayed) Labs Reviewed  CBC WITH DIFFERENTIAL/PLATELET  COMPREHENSIVE METABOLIC PANEL WITH GFR  URINALYSIS, ROUTINE W REFLEX MICROSCOPIC  LIPASE, BLOOD    EKG: None  Radiology: No results found.   Procedures   Medications Ordered in the ED  ondansetron  (ZOFRAN ) injection 4 mg (has no administration in time range)  morphine  (PF) 4 MG/ML injection 4 mg (has no administration in time range)  ketorolac  (TORADOL ) 30 MG/ML injection 30 mg (has no administration in time range)  sodium chloride  0.9 % bolus 1,000 mL (has no administration in time range)                                    Medical Decision Making Amount and/or Complexity of Data Reviewed Labs: ordered. Radiology: ordered.  Risk Prescription drug management.   Patient presenting with lower abdominal pain as described in the HPI.  Patient arrives here with stable vital signs and is afebrile.  Physical examination reveals tenderness to the right lower quadrant and right flank, but no peritoneal signs.  Laboratory studies obtained including CBC, CMP, and lipase.  Lipase is mildly elevated at 76, but the remainder of the laboratory studies are unremarkable.  Urinalysis concerning for infection.  CT scan of the abdomen and pelvis obtained showing obstructive uropathy due to tandem right ureteral stones with a larger measuring 11 mm.  Patient has been given IV Rocephin .  She has also received morphine  and Toradol  for pain along with Zofran  for nausea and seems to be feeling somewhat better.  Care has been discussed with Dr. Mitchell from urology.  As the patient is not febrile, has no white count, and is nontoxic-appearing, she  feels as though the patient can safely be discharged with outpatient follow-up.  I will prescribe antibiotics along with pain medication.     Final diagnoses:  None    ED Discharge Orders     None          Geroldine Berg, MD 12/21/23 2309

## 2023-12-16 ENCOUNTER — Encounter (HOSPITAL_COMMUNITY): Payer: Self-pay | Admitting: Urology

## 2023-12-16 ENCOUNTER — Other Ambulatory Visit: Payer: Self-pay

## 2023-12-16 ENCOUNTER — Inpatient Hospital Stay (HOSPITAL_BASED_OUTPATIENT_CLINIC_OR_DEPARTMENT_OTHER): Admitting: Anesthesiology

## 2023-12-16 ENCOUNTER — Other Ambulatory Visit: Payer: Self-pay | Admitting: Urology

## 2023-12-16 ENCOUNTER — Encounter (HOSPITAL_COMMUNITY)
Admission: RE | Disposition: A | Discharge status: Home / Self Care | Payer: Self-pay | Source: Home / Self Care | Attending: Urology

## 2023-12-16 ENCOUNTER — Inpatient Hospital Stay (HOSPITAL_COMMUNITY)

## 2023-12-16 ENCOUNTER — Ambulatory Visit (HOSPITAL_COMMUNITY)
Admission: RE | Admit: 2023-12-16 | Discharge: 2023-12-16 | Disposition: A | Discharge status: Home / Self Care | Attending: Urology | Admitting: Urology

## 2023-12-16 ENCOUNTER — Inpatient Hospital Stay (HOSPITAL_COMMUNITY): Admitting: Anesthesiology

## 2023-12-16 DIAGNOSIS — E119 Type 2 diabetes mellitus without complications: Secondary | ICD-10-CM | POA: Insufficient documentation

## 2023-12-16 DIAGNOSIS — E039 Hypothyroidism, unspecified: Secondary | ICD-10-CM | POA: Insufficient documentation

## 2023-12-16 DIAGNOSIS — N132 Hydronephrosis with renal and ureteral calculous obstruction: Secondary | ICD-10-CM | POA: Diagnosis not present

## 2023-12-16 DIAGNOSIS — Z87891 Personal history of nicotine dependence: Secondary | ICD-10-CM | POA: Insufficient documentation

## 2023-12-16 DIAGNOSIS — N202 Calculus of kidney with calculus of ureter: Secondary | ICD-10-CM | POA: Diagnosis not present

## 2023-12-16 DIAGNOSIS — K219 Gastro-esophageal reflux disease without esophagitis: Secondary | ICD-10-CM | POA: Insufficient documentation

## 2023-12-16 DIAGNOSIS — G4730 Sleep apnea, unspecified: Secondary | ICD-10-CM | POA: Diagnosis not present

## 2023-12-16 DIAGNOSIS — N201 Calculus of ureter: Secondary | ICD-10-CM

## 2023-12-16 HISTORY — PX: CYSTOSCOPY W/ URETERAL STENT PLACEMENT: SHX1429

## 2023-12-16 LAB — GLUCOSE, CAPILLARY: Glucose-Capillary: 99 mg/dL (ref 70–99)

## 2023-12-16 SURGERY — CYSTOSCOPY, WITH RETROGRADE PYELOGRAM AND URETERAL STENT INSERTION
Anesthesia: General | Laterality: Right

## 2023-12-16 MED ORDER — OXYCODONE HCL 5 MG/5ML PO SOLN: 5.0000 mg | Freq: Once | ORAL | Status: DC | PRN

## 2023-12-16 MED ORDER — PROPOFOL 10 MG/ML IV BOLUS
INTRAVENOUS | Status: DC | PRN
  Administered 2023-12-16 (×2): 50 mg via INTRAVENOUS
  Administered 2023-12-16: 100 mg via INTRAVENOUS

## 2023-12-16 MED ORDER — PHENYLEPHRINE 80 MCG/ML (10ML) SYRINGE FOR IV PUSH (FOR BLOOD PRESSURE SUPPORT)
PREFILLED_SYRINGE | INTRAVENOUS | Status: DC | PRN
  Administered 2023-12-16: 80 ug via INTRAVENOUS

## 2023-12-16 MED ORDER — IOHEXOL 300 MG/ML  SOLN
INTRAMUSCULAR | Status: DC | PRN
  Administered 2023-12-16: 6 mL via URETHRAL

## 2023-12-16 MED ORDER — PHENYLEPHRINE HCL-NACL 20-0.9 MG/250ML-% IV SOLN: INTRAVENOUS | Status: DC | PRN

## 2023-12-16 MED ORDER — CEFAZOLIN SODIUM-DEXTROSE 2-4 GM/100ML-% IV SOLN
2.0000 g | INTRAVENOUS | Status: AC
  Administered 2023-12-16: 2 g via INTRAVENOUS
  Filled 2023-12-16: qty 100

## 2023-12-16 MED ORDER — LIDOCAINE 2% (20 MG/ML) 5 ML SYRINGE
INTRAMUSCULAR | Status: DC | PRN
  Administered 2023-12-16: 40 mg via INTRAVENOUS

## 2023-12-16 MED ORDER — FENTANYL CITRATE PF 50 MCG/ML IJ SOSY: 25.0000 ug | PREFILLED_SYRINGE | INTRAMUSCULAR | Status: DC | PRN

## 2023-12-16 MED ORDER — ONDANSETRON HCL 4 MG/2ML IJ SOLN
INTRAMUSCULAR | Status: DC | PRN
Start: 2023-12-16 — End: 2023-12-16
  Administered 2023-12-16: 4 mg via INTRAVENOUS

## 2023-12-16 MED ORDER — BSS IO SOLN
15.0000 mL | Freq: Once | INTRAOCULAR | Status: AC
  Administered 2023-12-16: 15 mL
  Filled 2023-12-16: qty 15

## 2023-12-16 MED ORDER — OXYCODONE HCL 5 MG PO TABS: 5.0000 mg | ORAL_TABLET | Freq: Once | ORAL | Status: DC | PRN

## 2023-12-16 MED ORDER — DEXAMETHASONE SODIUM PHOSPHATE 10 MG/ML IJ SOLN: INTRAMUSCULAR | Status: DC | PRN

## 2023-12-16 MED ORDER — DEXAMETHASONE SODIUM PHOSPHATE 4 MG/ML IJ SOLN
INTRAMUSCULAR | Status: DC | PRN
Start: 2023-12-16 — End: 2023-12-16
  Administered 2023-12-16: 5 mg via INTRAVENOUS

## 2023-12-16 MED ORDER — LACTATED RINGERS IV SOLN: INTRAVENOUS | Status: DC | PRN

## 2023-12-16 MED ORDER — ACETAMINOPHEN 500 MG PO TABS
1000.0000 mg | ORAL_TABLET | Freq: Once | ORAL | Status: AC
  Administered 2023-12-16: 1000 mg via ORAL
  Filled 2023-12-16: qty 2

## 2023-12-16 MED ORDER — FENTANYL CITRATE (PF) 250 MCG/5ML IJ SOLN
INTRAMUSCULAR | Status: DC | PRN
  Administered 2023-12-16: 50 ug via INTRAVENOUS

## 2023-12-16 MED ORDER — FENTANYL CITRATE (PF) 100 MCG/2ML IJ SOLN
INTRAMUSCULAR | Status: AC
  Filled 2023-12-16: qty 2

## 2023-12-16 MED ORDER — INSULIN ASPART 100 UNIT/ML IJ SOLN: 0.0000 [IU] | INTRAMUSCULAR | Status: DC | PRN

## 2023-12-16 MED ORDER — SUCCINYLCHOLINE CHLORIDE 200 MG/10ML IV SOSY
PREFILLED_SYRINGE | INTRAVENOUS | Status: DC | PRN
Start: 2023-12-16 — End: 2023-12-16
  Administered 2023-12-16: 100 mg via INTRAVENOUS

## 2023-12-16 MED ORDER — ONDANSETRON HCL 4 MG/2ML IJ SOLN: 4.0000 mg | Freq: Once | INTRAMUSCULAR | Status: DC | PRN

## 2023-12-16 MED ORDER — SODIUM CHLORIDE 0.9 % IR SOLN
Status: DC | PRN
  Administered 2023-12-16: 3000 mL via INTRAVESICAL

## 2023-12-16 SURGICAL SUPPLY — 12 items
BAG URO CATCHER STRL LF (MISCELLANEOUS) ×1 IMPLANT
CATH URETL OPEN END 6FR 70 (CATHETERS) IMPLANT
CLOTH BEACON ORANGE TIMEOUT ST (SAFETY) ×1 IMPLANT
GLOVE SURG LX STRL 7.5 STRW (GLOVE) ×1 IMPLANT
GOWN STRL REUS W/ TWL XL LVL3 (GOWN DISPOSABLE) ×1 IMPLANT
GUIDEWIRE STR DUAL SENSOR (WIRE) ×1 IMPLANT
GUIDEWIRE ZIPWRE .038 STRAIGHT (WIRE) IMPLANT
KIT TURNOVER KIT A (KITS) ×1 IMPLANT
MANIFOLD NEPTUNE II (INSTRUMENTS) ×1 IMPLANT
PACK CYSTO (CUSTOM PROCEDURE TRAY) ×1 IMPLANT
STENT URET 6FRX24 CONTOUR (STENTS) IMPLANT
TUBING CONNECTING 10 (TUBING) ×1 IMPLANT

## 2023-12-16 NOTE — Anesthesia Procedure Notes (Addendum)
 Procedure Name: Intubation Date/Time: 12/16/2023 5:15 PM  Performed by: Lucious Debby BRAVO, MDPre-anesthesia Checklist: Patient identified, Emergency Drugs available, Suction available and Patient being monitored Patient Re-evaluated:Patient Re-evaluated prior to induction Oxygen Delivery Method: Circle system utilized Preoxygenation: Pre-oxygenation with 100% oxygen Induction Type: IV induction Ventilation: Mask ventilation without difficulty Laryngoscope Size: Miller and 2 Grade View: Grade I Tube type: Oral Tube size: 7.0 mm Number of attempts: 1 Airway Equipment and Method: Stylet and Oral airway Placement Confirmation: ETT inserted through vocal cords under direct vision, positive ETCO2 and breath sounds checked- equal and bilateral Secured at: 22 cm Tube secured with: Tape Dental Injury: Teeth and Oropharynx as per pre-operative assessment  Comments: Unable to get LMA 3 or LMA 4 to seat with adequate seal. Decided to abort LMA and intubate. Mask ventilated intermittently, SaO2 >95% throughout.

## 2023-12-16 NOTE — Transfer of Care (Signed)
 Immediate Anesthesia Transfer of Care Note  Patient: Charlotte [name]  Procedure(s) Performed: CYSTOSCOPY, WITH RETROGRADE PYELOGRAM AND URETERAL STENT INSERTION (Right)  Patient Location: PACU  Anesthesia Type:General  Level of Consciousness: drowsy  Airway & Oxygen Therapy: Patient Spontanous Breathing  Post-op Assessment: Report given to RN and Post -op Vital signs reviewed and stable  Post vital signs: Reviewed and stable  Last Vitals:  Vitals Value Taken Time  BP 119/56 12/16/23 17:39  Temp    Pulse 85 12/16/23 17:42  Resp 16 12/16/23 17:42  SpO2 96 % 12/16/23 17:42  Vitals shown include unfiled device data.  Last Pain:  Vitals:   12/16/23 1530  PainSc: 5          Complications: No notable events documented.

## 2023-12-16 NOTE — Anesthesia Postprocedure Evaluation (Signed)
 Anesthesia Post Note  Patient: Charlotte [name]  Procedure(s) Performed: CYSTOSCOPY, WITH RETROGRADE PYELOGRAM AND URETERAL STENT INSERTION (Right)     Patient location during evaluation: PACU Anesthesia Type: General Level of consciousness: awake and alert Pain management: pain level controlled Vital Signs Assessment: post-procedure vital signs reviewed and stable Respiratory status: spontaneous breathing, nonlabored ventilation and respiratory function stable Cardiovascular status: stable and blood pressure returned to baseline Anesthetic complications: no   No notable events documented.  Last Vitals:  Vitals:   12/16/23 1800 12/16/23 1809  BP: 109/68 (!) 110/53  Pulse: 83 77  Resp: 14 20  Temp:    SpO2: 94% 98%    Last Pain:  Vitals:   12/16/23 1809  PainSc: 0-No pain                 Debby FORBES Like

## 2023-12-16 NOTE — Op Note (Signed)
 Preoperative diagnosis:  Right ureteral calculus   Postoperative diagnosis:  Right ureteral calculus   Procedure:  Cystoscopy Right ureteral stent placement (6 x 24 - no string)  Right retrograde pyelography with interpretation   Surgeon: Gretel CANDIE Renda Mickey. M.D.  Anesthesia: General  Complications: None  Intraoperative findings: Right retrograde pyelography was performed with Omnipaque  contrast and a 6 French ureteral catheter.  This revealed a large filling defect within the distal right ureter consistent with the patient's known ureteral stone burden.  There was proximal dilation of the ureter above this level without other filling defects.  EBL: Minimal  Specimens: None  Indication: Charlotte [name] is a 73 y.o. patient with an obstructing right ureteral stone and pain. After reviewing the management options for treatment, he elected to proceed with the above surgical procedure(s). We have discussed the potential benefits and risks of the procedure, side effects of the proposed treatment, the likelihood of the patient achieving the goals of the procedure, and any potential problems that might occur during the procedure or recuperation. Informed consent has been obtained.  Description of procedure:  The patient was taken to the operating room and general anesthesia was induced.  The patient was placed in the dorsal lithotomy position, prepped and draped in the usual sterile fashion, and preoperative antibiotics were administered. A preoperative time-out was performed.   Cystourethroscopy was performed.  The patient's urethra was examined and was normal. The bladder was then systematically examined in its entirety. There was no evidence for any bladder tumors, stones, or other mucosal pathology.    Attention then turned to the right ureteral orifice and a ureteral catheter was used to intubate the ureteral orifice.  Omnipaque  contrast was injected through the ureteral catheter and a  retrograde pyelogram was performed with findings as dictated above.  A 0.38 sensor guidewire was then advanced up the right ureter into the renal pelvis under fluoroscopic guidance.  The wire was then backloaded through the cystoscope and a ureteral stent was advance over the wire using Seldinger technique.  The stent was positioned appropriately under fluoroscopic and cystoscopic guidance.  The wire was then removed with an adequate stent curl noted in the renal pelvis as well as in the bladder.  The bladder was then emptied and the procedure ended.  The patient appeared to tolerate the procedure well and without complications.  The patient was able to be awakened and transferred to the recovery unit in satisfactory condition.    Gretel CANDIE Renda Teddie MD

## 2023-12-16 NOTE — H&P (Signed)
 Office Visit Report     12/16/2023   --------------------------------------------------------------------------------   Charlotte [name]  MRN: [id number]  DOB: 10/15/1950, 73 year old Female  SSN:    PRIMARY CARE:     REFERRING:    PROVIDER:  Gretel Ferrara, M.D.  LOCATION:  [facility] [phone]     --------------------------------------------------------------------------------   CC/HPI: Right ureteral calculus   Charlotte [name] is a 73 year old female with no prior history of urolithiasis. She developed the acute onset of crampy abdominal pain with subsequent radiation to her right lower quadrant on Monday that progressed over 24 hours causing her to present to the emergency department early Tuesday morning. She had associated nausea and vomiting. She denies any objective fever. She has no prior history of kidney stones. A CT scan was performed which demonstrated what appears to be either an 11 mm mid right ureteral calculus or possibly 2 calculi next which other. She also has a large 11 mm nonobstructing right renal calculus. She was discharged home on oxycodone  and has been taking half of an oxycodone  tablet every 4 hours for pain management which has been decently controlled.     ALLERGIES: Gabapentin  Pantoprazole Pravastatin     MEDICATIONS: ALPRAZolam 0.5 MG Tablet Disintegrating  Biotin  HYDROcodone -Acetaminophen   Krill Oil  Methocarbamol   Ondansetron   Synthroid  75 MCG Tablet     GU PSH: None   NON-GU PSH: Breast lumpectomy, Right Shoulder Surgery (Unspecified), Left - 10/03/2023 Thyroid  Surgery     GU PMH: None   NON-GU PMH: GERD Hypercholesterolemia Hypothyroidism Malignant neoplasm of unspecified site of unspecified female breast    FAMILY HISTORY: None   SOCIAL HISTORY: Marital Status: Married Preferred Language: English; Ethnicity: Not Hispanic Or Latino; Race: White Current Smoking Status: Patient does not smoke anymore.   Tobacco  Use Assessment Completed: Used Tobacco in last 30 days? Drinks 1 caffeinated drink per day.    REVIEW OF SYSTEMS:    GU Review Female:   Patient reports frequent urination, hard to postpone urination, burning /pain with urination, get up at night to urinate, leakage of urine, stream starts and stops, trouble starting your stream, and have to strain to urinate. Patient denies currently pregnant.  Gastrointestinal (Upper):   Patient reports nausea and vomiting.   Gastrointestinal (Lower):   Patient reports diarrhea and constipation.   Constitutional:   Patient reports night sweats, weight loss, and fatigue. Patient denies fever.  Skin:   Patient reports itching. Patient denies skin rash/ lesion.  Eyes:   Patient reports blurred vision. Patient denies double vision.  Ears/ Nose/ Throat:   Patient denies sore throat and sinus problems.  Hematologic/Lymphatic:   Patient reports easy bruising. Patient denies swollen glands.  Cardiovascular:   Patient denies leg swelling and chest pains.  Respiratory:   Patient reports cough. Patient denies shortness of breath.  Endocrine:   Patient denies excessive thirst.  Musculoskeletal:   Patient reports back pain. Patient denies joint pain.  Neurological:   Patient denies headaches and dizziness.  Psychologic:   Patient denies depression and anxiety.   VITAL SIGNS:      12/16/2023 09:12 AM  Weight 120 lb / 54.43 kg  Height 63 in / 160.02 cm  BP 85/49 mmHg  Pulse 59 /min  Temperature 97.3 F / 36.2 C  BMI 21.3 kg/m   MULTI-SYSTEM PHYSICAL EXAMINATION:    Constitutional: Well-nourished. No physical deformities. Normally developed. Good grooming.  Respiratory: No labored breathing, no use of accessory muscles.  Cardiovascular: Normal temperature, normal extremity pulses, no swelling, no varicosities.  Gastrointestinal: Mild to moderate right CVA tenderness and right abdominal tenderness.     Complexity of Data:  Records Review:   Previous Patient  Records  X-Ray Review: KUB: Reviewed Films.  C.T. Abdomen/Pelvis: Reviewed Films.    Notes:                     CLINICAL DATA: 73 year old female with abdominal pain radiating to  the back onset 2 days ago. Dark colored urine and dysuria.   EXAM:  CT ABDOMEN AND PELVIS WITHOUT CONTRAST   TECHNIQUE:  Multidetector CT imaging of the abdomen and pelvis was performed  following the standard protocol without IV contrast.   RADIATION DOSE REDUCTION: This exam was performed according to the  departmental dose-optimization program which includes automated  exposure control, adjustment of the mA and/or kV according to  patient size and/or use of iterative reconstruction technique.   COMPARISON: None Available.   FINDINGS:  Lower chest: Negative. No pericardial or pleural effusion.   Hepatobiliary: Negative noncontrast liver and gallbladder.   Pancreas: Negative.   Spleen: Negative.   Adrenals/Urinary Tract: Normal adrenal glands.   Nonobstructed left kidney. Simple fluid density left renal upper  pole cyst (no follow-up imaging recommended). No left  nephrolithiasis identified. Diminutive left ureter.   Right nephromegaly and hydronephrosis. Large right mid to lower pole  renal calculus measuring 11 mm in length on coronal image 51. Right  hydroureter with urothelial thickening and periureteral inflammatory  stranding tracking from the right kidney into the pelvis (coronal  image 46, series 2, image 43). And 2 adjacent ureteral calculi along  the right pelvic side wall best demonstrated on coronal images 33  and 34. The more proximal stone is 5-6 mm length. The distal stone  is 11 mm.   And distal to that the right ureter is decompressed to the  ureterovesical junction. The bladder is diminutive. There are  multiple small pelvic phleboliths.   Stomach/Bowel: Mildly redundant but mostly decompressed large bowel  in the pelvis. Redundant transverse colon with mild retained gas  and  stool. Negative right colon. Normal gas containing appendix on  coronal image 48. Nondilated small bowel in the abdomen and pelvis.  Mostly decompressed stomach and duodenum. No pneumoperitoneum. No  free fluid identified.   Vascular/Lymphatic: Normal caliber abdominal aorta. Minimal  calcified atherosclerosis. Vascular patency is not evaluated in the  absence of IV contrast. No lymphadenopathy identified.   Reproductive: Surgically absent uterus. Normal left ovary along the  pelvic side wall. Right ovary diminutive or absent.   Other: No pelvic free fluid.   Musculoskeletal: Lumbar spine degeneration maximal at L4-L5, vacuum  disc and severe disc space loss there. No acute or suspicious  osseous lesion identified.   IMPRESSION:  1. Right side Acute Obstructive Uropathy due to tandem right  ureteral stones in the upper pelvis, the larger is 11 mm. And bulky  additional 11 mm calculus within the hydronephrotic right kidney.  Recommend Urology consultation.  2. Negative left kidney and ureter.  3. Normal appendix. No other acute or inflammatory process  identified in the noncontrast abdomen or pelvis.    Electronically Signed  By: VEAR Hurst M.D.  On: 12/15/2023 04:28    I independently reviewed her KUB x-ray. This demonstrated radiopacity of her mid right ureteral stone burden.   PROCEDURES:         KUB - [id number]  A single view  of the abdomen is obtained.      Patient confirmed No Neulasta OnPro Device.           Urinalysis Dipstick Dipstick Cont'd  Color: Yellow Bilirubin: Neg mg/dL  Appearance: Clear Ketones: Neg mg/dL  Specific Gravity: 8.984 Blood: Neg ery/uL  pH: <=5.0 Protein: Trace mg/dL  Glucose: Neg mg/dL Urobilinogen: 0.2 mg/dL    Nitrites: Neg    Leukocyte Esterase: Neg leu/uL    ASSESSMENT:      ICD-10 Details  1 GU:   Renal and ureteral calculus - N20.2    PLAN:           Orders X-Rays: KUB          Schedule         Document Letter(s):   Created for Patient: Clinical Summary   Created for Patient: Clinical Summary         Notes:   1. Right renal and ureteral calculi: I had a long discussion with Charlotte [name] after reviewing her CT scan and KUB today. We have reviewed options for definitive therapy including shockwave lithotripsy versus ureteroscopic laser lithotripsy and the pros and cons of each approach. She would like to avoid ureteroscopy and is most interested in shockwave lithotripsy of her ureteral stone burden. However, she does not feel that she can wait until 10 days from now which would be the next availability for shockwave lithotripsy. As such, she would like to proceed with cystoscopy and right ureteral stent placement today for pain management. We have reviewed the potential risks and benefits of this procedure and she gives informed consent to proceed. She will then be scheduled for shockwave lithotripsy next week for her ureteral stone.   She does have a history of hypercalcemia but has been seeing endocrinology and apparently does not have an elevated parathyroid hormone level. We will discuss proceeding with a metabolic evaluation and obtain any 24-hour urine records from her other physicians.           Next Appointment:      Next Appointment: 12/25/2023 07:30 AM    Appointment Type: Surgery     Location: [facility] [phone]    Provider: Glendia Elizabeth, M.D.    Reason for Visit: WL/OP (R) ESWL

## 2023-12-16 NOTE — Discharge Instructions (Addendum)

## 2023-12-16 NOTE — Anesthesia Preprocedure Evaluation (Addendum)
 Anesthesia Evaluation  Patient identified by MRN, date of birth, ID band Patient awake    Reviewed: Allergy & Precautions, NPO status , Patient's Chart, lab work & pertinent test results  History of Anesthesia Complications (+) PONV and history of anesthetic complications  Airway Mallampati: II  TM Distance: >3 FB Neck ROM: Full    Dental  (+) Dental Advisory Given   Pulmonary sleep apnea , former smoker   Pulmonary exam normal        Cardiovascular negative cardio ROS Normal cardiovascular exam     Neuro/Psych negative neurological ROS  negative psych ROS   GI/Hepatic Neg liver ROS,GERD  Controlled,,  Endo/Other  diabetes, Type 2Hypothyroidism    Renal/GU negative Renal ROS     Musculoskeletal negative musculoskeletal ROS (+)    Abdominal   Peds  Hematology negative hematology ROS (+)   Anesthesia Other Findings   Reproductive/Obstetrics  Breast cancer                               Anesthesia Physical Anesthesia Plan  ASA: 2  Anesthesia Plan: General   Post-op Pain Management: Tylenol  PO (pre-op)*   Induction: Intravenous  PONV Risk Score and Plan: 4 or greater and Treatment may vary due to age or medical condition, Ondansetron  and Propofol  infusion  Airway Management Planned: LMA  Additional Equipment: None  Intra-op Plan:   Post-operative Plan: Extubation in OR  Informed Consent: I have reviewed the patients History and Physical, chart, labs and discussed the procedure including the risks, benefits and alternatives for the proposed anesthesia with the patient or authorized representative who has indicated his/her understanding and acceptance.     Dental advisory given  Plan Discussed with: CRNA and Anesthesiologist  Anesthesia Plan Comments:          Anesthesia Quick Evaluation

## 2023-12-17 ENCOUNTER — Encounter (HOSPITAL_COMMUNITY): Payer: Self-pay | Admitting: Urology

## 2023-12-23 NOTE — Progress Notes (Signed)
 Spoke w/ via phone for pre-op interview  Patient Lab needs dos-KUB-         Lab results------ COVID test --Not indicated---patient states asymptomatic no test needed Arrive at ---0600- NPO after MN  Pre-Surgery Ensure or G2:  Med rec completed Medications to take morning of surgery --If needed, oxy, eye drops, nasal spray Diabetic medication ---none  GLP1 agonist last dose: GLP1 instructions:  Patient instructed no nail polish to be worn day of surgery Patient instructed to bring photo id and insurance card day of surgery Patient aware to have Driver (ride ) / caregiver    for 24 hours after surgery -  Patient Special Instructions -----spouse- Rutha [phone] Pre-Op special Instructions --Per [employer] and [employer]. On Thursday , take laxative of choice, hydrate well and eat light meal that evening, Do not wear metal from waist down, no flip flops, sandals, bring blue folder from [facility], no money valuables or credit cards.  Patient verbalized understanding of instructions that were given at this phone interview. Patient denies chest pain, sob, fever, cough at the interview.

## 2023-12-23 NOTE — H&P (Signed)
 Right ureteral calculus   Charlotte [name] is a 73 year old female with no prior history of urolithiasis. She developed the acute onset of crampy abdominal pain with subsequent radiation to her right lower quadrant on Monday that progressed over 24 hours causing her to present to the emergency department early Tuesday morning. She had associated nausea and vomiting. She denies any objective fever. She has no prior history of kidney stones. A CT scan was performed which demonstrated what appears to be either an 11 mm mid right ureteral calculus or possibly 2 calculi next which other. She also has a large 11 mm nonobstructing right renal calculus. She was discharged home on oxycodone  and has been taking half of an oxycodone  tablet every 4 hours for pain management which has been decently controlled.     ALLERGIES: Gabapentin  Pantoprazole Pravastatin     MEDICATIONS: ALPRAZolam 0.5 MG Tablet Disintegrating  Biotin  HYDROcodone -Acetaminophen   Krill Oil  Methocarbamol   Ondansetron   Synthroid  75 MCG Tablet     GU PSH: None   NON-GU PSH: Breast lumpectomy, Right Shoulder Surgery (Unspecified), Left - 10/03/2023 Thyroid  Surgery     GU PMH: None   NON-GU PMH: GERD Hypercholesterolemia Hypothyroidism Malignant neoplasm of unspecified site of unspecified female breast    FAMILY HISTORY: None   SOCIAL HISTORY: Marital Status: Married Preferred Language: English; Ethnicity: Not Hispanic Or Latino; Race: White Current Smoking Status: Patient does not smoke anymore.   Tobacco Use Assessment Completed: Used Tobacco in last 30 days? Drinks 1 caffeinated drink per day.    REVIEW OF SYSTEMS:    GU Review Female:   Patient reports frequent urination, hard to postpone urination, burning /pain with urination, get up at night to urinate, leakage of urine, stream starts and stops, trouble starting your stream, and have to strain to urinate. Patient denies currently pregnant.  Gastrointestinal (Upper):    Patient reports nausea and vomiting.   Gastrointestinal (Lower):   Patient reports diarrhea and constipation.   Constitutional:   Patient reports night sweats, weight loss, and fatigue. Patient denies fever.  Skin:   Patient reports itching. Patient denies skin rash/ lesion.  Eyes:   Patient reports blurred vision. Patient denies double vision.  Ears/ Nose/ Throat:   Patient denies sore throat and sinus problems.  Hematologic/Lymphatic:   Patient reports easy bruising. Patient denies swollen glands.  Cardiovascular:   Patient denies leg swelling and chest pains.  Respiratory:   Patient reports cough. Patient denies shortness of breath.  Endocrine:   Patient denies excessive thirst.  Musculoskeletal:   Patient reports back pain. Patient denies joint pain.  Neurological:   Patient denies headaches and dizziness.  Psychologic:   Patient denies depression and anxiety.   VITAL SIGNS:      12/16/2023 09:12 AM  Weight 120 lb / 54.43 kg  Height 63 in / 160.02 cm  BP 85/49 mmHg  Pulse 59 /min  Temperature 97.3 F / 36.2 C  BMI 21.3 kg/m   MULTI-SYSTEM PHYSICAL EXAMINATION:    Constitutional: Well-nourished. No physical deformities. Normally developed. Good grooming.  Respiratory: No labored breathing, no use of accessory muscles.   Cardiovascular: Normal temperature, normal extremity pulses, no swelling, no varicosities.  Gastrointestinal: Mild to moderate right CVA tenderness and right abdominal tenderness.     Complexity of Data:  Records Review:   Previous Patient Records  X-Ray Review: KUB: Reviewed Films.  C.T. Abdomen/Pelvis: Reviewed Films.    Notes:  CLINICAL DATA: 73 year old female with abdominal pain radiating to  the back onset 2 days ago. Dark colored urine and dysuria.   EXAM:  CT ABDOMEN AND PELVIS WITHOUT CONTRAST   TECHNIQUE:  Multidetector CT imaging of the abdomen and pelvis was performed  following the standard protocol without IV contrast.    RADIATION DOSE REDUCTION: This exam was performed according to the  departmental dose-optimization program which includes automated  exposure control, adjustment of the mA and/or kV according to  patient size and/or use of iterative reconstruction technique.   COMPARISON: None Available.   FINDINGS:  Lower chest: Negative. No pericardial or pleural effusion.   Hepatobiliary: Negative noncontrast liver and gallbladder.   Pancreas: Negative.   Spleen: Negative.   Adrenals/Urinary Tract: Normal adrenal glands.   Nonobstructed left kidney. Simple fluid density left renal upper  pole cyst (no follow-up imaging recommended). No left  nephrolithiasis identified. Diminutive left ureter.   Right nephromegaly and hydronephrosis. Large right mid to lower pole  renal calculus measuring 11 mm in length on coronal image 51. Right  hydroureter with urothelial thickening and periureteral inflammatory  stranding tracking from the right kidney into the pelvis (coronal  image 46, series 2, image 43). And 2 adjacent ureteral calculi along  the right pelvic side wall best demonstrated on coronal images 33  and 34. The more proximal stone is 5-6 mm length. The distal stone  is 11 mm.   And distal to that the right ureter is decompressed to the  ureterovesical junction. The bladder is diminutive. There are  multiple small pelvic phleboliths.   Stomach/Bowel: Mildly redundant but mostly decompressed large bowel  in the pelvis. Redundant transverse colon with mild retained gas and  stool. Negative right colon. Normal gas containing appendix on  coronal image 48. Nondilated small bowel in the abdomen and pelvis.  Mostly decompressed stomach and duodenum. No pneumoperitoneum. No  free fluid identified.   Vascular/Lymphatic: Normal caliber abdominal aorta. Minimal  calcified atherosclerosis. Vascular patency is not evaluated in the  absence of IV contrast. No lymphadenopathy identified.    Reproductive: Surgically absent uterus. Normal left ovary along the  pelvic side wall. Right ovary diminutive or absent.   Other: No pelvic free fluid.   Musculoskeletal: Lumbar spine degeneration maximal at L4-L5, vacuum  disc and severe disc space loss there. No acute or suspicious  osseous lesion identified.   IMPRESSION:  1. Right side Acute Obstructive Uropathy due to tandem right  ureteral stones in the upper pelvis, the larger is 11 mm. And bulky  additional 11 mm calculus within the hydronephrotic right kidney.  Recommend Urology consultation.  2. Negative left kidney and ureter.  3. Normal appendix. No other acute or inflammatory process  identified in the noncontrast abdomen or pelvis.    Electronically Signed  By: VEAR Hurst M.D.  On: 12/15/2023 04:28    I independently reviewed her KUB x-ray. This demonstrated radiopacity of her mid right ureteral stone burden.   PROCEDURES:         KUB - [id number]  A single view of the abdomen is obtained.      Patient confirmed No Neulasta OnPro Device.           Urinalysis Dipstick Dipstick Cont'd  Color: Yellow Bilirubin: Neg mg/dL  Appearance: Clear Ketones: Neg mg/dL  Specific Gravity: 8.984 Blood: Neg ery/uL  pH: <=5.0 Protein: Trace mg/dL  Glucose: Neg mg/dL Urobilinogen: 0.2 mg/dL    Nitrites: Neg    Leukocyte Esterase:  Neg leu/uL    ASSESSMENT:      ICD-10 Details  1 GU:   Renal and ureteral calculus - N20.2    PLAN:           Orders X-Rays: KUB          Schedule         Document Letter(s):  Created for Patient: Clinical Summary         Notes:   1. Right renal and ureteral calculi: I had a long discussion with Ms. Heffern after reviewing her CT scan and KUB today. We have reviewed options for definitive therapy including shockwave lithotripsy versus ureteroscopic laser lithotripsy and the pros and cons of each approach. She would like to avoid ureteroscopy and is most interested in shockwave lithotripsy  of her ureteral stone burden. However, she does not feel that she can wait until 10 days from now which would be the next availability for shockwave lithotripsy. As such, she would like to proceed with cystoscopy and right ureteral stent placement today for pain management. We have reviewed the potential risks and benefits of this procedure and she gives informed consent to proceed. She will then be scheduled for shockwave lithotripsy next week for her ureteral stone.   She does have a history of hypercalcemia but has been seeing endocrinology and apparently does not have an elevated parathyroid hormone level. We will discuss proceeding with a metabolic evaluation and obtain any 24-hour urine records from her other physicians.

## 2023-12-25 ENCOUNTER — Encounter (HOSPITAL_COMMUNITY): Payer: Self-pay | Admitting: Urology

## 2023-12-25 ENCOUNTER — Other Ambulatory Visit: Payer: Self-pay

## 2023-12-25 ENCOUNTER — Ambulatory Visit (HOSPITAL_COMMUNITY)
Admission: RE | Admit: 2023-12-25 | Discharge: 2023-12-25 | Disposition: A | Discharge status: Home / Self Care | Attending: Urology | Admitting: Urology

## 2023-12-25 ENCOUNTER — Encounter (HOSPITAL_COMMUNITY)
Admission: RE | Disposition: A | Discharge status: Home / Self Care | Payer: Self-pay | Source: Home / Self Care | Attending: Urology

## 2023-12-25 ENCOUNTER — Ambulatory Visit (HOSPITAL_COMMUNITY)

## 2023-12-25 DIAGNOSIS — N201 Calculus of ureter: Secondary | ICD-10-CM | POA: Diagnosis not present

## 2023-12-25 DIAGNOSIS — E119 Type 2 diabetes mellitus without complications: Secondary | ICD-10-CM | POA: Diagnosis not present

## 2023-12-25 DIAGNOSIS — Z87891 Personal history of nicotine dependence: Secondary | ICD-10-CM | POA: Insufficient documentation

## 2023-12-25 DIAGNOSIS — Z853 Personal history of malignant neoplasm of breast: Secondary | ICD-10-CM | POA: Diagnosis not present

## 2023-12-25 DIAGNOSIS — N132 Hydronephrosis with renal and ureteral calculous obstruction: Secondary | ICD-10-CM | POA: Insufficient documentation

## 2023-12-25 DIAGNOSIS — Z960 Presence of urogenital implants: Secondary | ICD-10-CM | POA: Diagnosis not present

## 2023-12-25 DIAGNOSIS — N202 Calculus of kidney with calculus of ureter: Secondary | ICD-10-CM | POA: Diagnosis not present

## 2023-12-25 HISTORY — DX: Idiopathic hypersomnia with long sleep time: G47.11

## 2023-12-25 HISTORY — PX: EXTRACORPOREAL SHOCK WAVE LITHOTRIPSY: SHX1557

## 2023-12-25 SURGERY — LITHOTRIPSY, ESWL
Anesthesia: LOCAL | Laterality: Right

## 2023-12-25 MED ORDER — CIPROFLOXACIN HCL 500 MG PO TABS
500.0000 mg | ORAL_TABLET | ORAL | Status: AC
  Administered 2023-12-25: 500 mg via ORAL
  Filled 2023-12-25: qty 1

## 2023-12-25 MED ORDER — DIPHENHYDRAMINE HCL 25 MG PO CAPS
25.0000 mg | ORAL_CAPSULE | ORAL | Status: AC
  Administered 2023-12-25: 25 mg via ORAL
  Filled 2023-12-25: qty 1

## 2023-12-25 MED ORDER — DIAZEPAM 5 MG PO TABS
5.0000 mg | ORAL_TABLET | ORAL | Status: AC
Start: 2023-12-25 — End: 2023-12-25
  Administered 2023-12-25: 5 mg via ORAL
  Filled 2023-12-25: qty 1

## 2023-12-25 MED ORDER — SODIUM CHLORIDE 0.9 % IV SOLN
INTRAVENOUS | Status: DC
  Administered 2023-12-25: 1000 mL via INTRAVENOUS

## 2023-12-25 NOTE — Discharge Instructions (Addendum)
 I have reviewed discharge instructions in detail with the patient. They will follow-up with me or their physician as scheduled. My nurse will also be calling the patients as per protocol.

## 2023-12-25 NOTE — Interval H&P Note (Signed)
 History and Physical Interval Note:  12/25/2023 7:21 AM  Charlotte [name]  has presented today for surgery, with the diagnosis of RIGHT URETERAL CALCULUS.  The various methods of treatment have been discussed with the patient and family. After consideration of risks, benefits and other options for treatment, the patient has consented to  Procedure(s): LITHOTRIPSY, ESWL (Right) as a surgical intervention.  The patient's history has been reviewed, patient examined, no change in status, stable for surgery.  I have reviewed the patient's chart and labs.  Questions were answered to the patient's satisfaction.     Lorayne Getchell A Wynell Halberg

## 2023-12-28 ENCOUNTER — Encounter (HOSPITAL_COMMUNITY): Payer: Self-pay | Admitting: Urology

## 2023-12-28 DIAGNOSIS — N202 Calculus of kidney with calculus of ureter: Secondary | ICD-10-CM | POA: Diagnosis not present

## 2024-01-01 DIAGNOSIS — R52 Pain, unspecified: Secondary | ICD-10-CM | POA: Diagnosis not present

## 2024-01-01 DIAGNOSIS — E782 Mixed hyperlipidemia: Secondary | ICD-10-CM | POA: Diagnosis not present

## 2024-01-01 DIAGNOSIS — E1165 Type 2 diabetes mellitus with hyperglycemia: Secondary | ICD-10-CM | POA: Diagnosis not present

## 2024-01-01 DIAGNOSIS — R7309 Other abnormal glucose: Secondary | ICD-10-CM | POA: Diagnosis not present

## 2024-01-11 DIAGNOSIS — M899 Disorder of bone, unspecified: Secondary | ICD-10-CM | POA: Diagnosis not present

## 2024-01-11 DIAGNOSIS — R509 Fever, unspecified: Secondary | ICD-10-CM | POA: Diagnosis not present

## 2024-01-11 DIAGNOSIS — N390 Urinary tract infection, site not specified: Secondary | ICD-10-CM | POA: Diagnosis not present

## 2024-01-11 DIAGNOSIS — E1165 Type 2 diabetes mellitus with hyperglycemia: Secondary | ICD-10-CM | POA: Diagnosis not present

## 2024-01-11 DIAGNOSIS — R52 Pain, unspecified: Secondary | ICD-10-CM | POA: Diagnosis not present

## 2024-01-11 DIAGNOSIS — N200 Calculus of kidney: Secondary | ICD-10-CM | POA: Diagnosis not present

## 2024-01-11 DIAGNOSIS — E782 Mixed hyperlipidemia: Secondary | ICD-10-CM | POA: Diagnosis not present

## 2024-01-11 DIAGNOSIS — R627 Adult failure to thrive: Secondary | ICD-10-CM | POA: Diagnosis not present

## 2024-01-12 LAB — LAB REPORT - SCANNED
A1c: 6.6
Calcium: 11.1
EGFR: 59
PTH: 29

## 2024-01-17 LAB — LAB REPORT - SCANNED: EGFR: 68

## 2024-01-18 DIAGNOSIS — N202 Calculus of kidney with calculus of ureter: Secondary | ICD-10-CM | POA: Diagnosis not present

## 2024-01-18 DIAGNOSIS — N390 Urinary tract infection, site not specified: Secondary | ICD-10-CM | POA: Diagnosis not present

## 2024-01-18 DIAGNOSIS — Z466 Encounter for fitting and adjustment of urinary device: Secondary | ICD-10-CM | POA: Diagnosis not present

## 2024-01-18 DIAGNOSIS — R399 Unspecified symptoms and signs involving the genitourinary system: Secondary | ICD-10-CM | POA: Diagnosis not present

## 2024-01-25 ENCOUNTER — Other Ambulatory Visit (HOSPITAL_COMMUNITY): Payer: Self-pay

## 2024-02-01 ENCOUNTER — Other Ambulatory Visit: Payer: Self-pay

## 2024-02-01 ENCOUNTER — Other Ambulatory Visit (HOSPITAL_COMMUNITY): Payer: Self-pay

## 2024-02-01 MED ORDER — CEPHALEXIN 250 MG PO TABS
ORAL_TABLET | Freq: Every day | ORAL | 2 refills | Status: DC
  Filled 2024-02-01: qty 30, 60d supply, fill #0

## 2024-02-02 ENCOUNTER — Other Ambulatory Visit (HOSPITAL_COMMUNITY): Payer: Self-pay

## 2024-02-02 MED ORDER — FLUZONE HIGH-DOSE 0.5 ML IM SUSY
PREFILLED_SYRINGE | INTRAMUSCULAR | 0 refills | Status: DC
  Filled 2024-02-02: qty 0.5, 1d supply, fill #0

## 2024-02-02 MED ORDER — FLUZONE 0.5 ML IM SUSY
0.5000 mL | PREFILLED_SYRINGE | Freq: Once | INTRAMUSCULAR | 0 refills | Status: AC
  Filled 2024-02-02: qty 0.5, 1d supply, fill #0

## 2024-02-04 DIAGNOSIS — Z8249 Family history of ischemic heart disease and other diseases of the circulatory system: Secondary | ICD-10-CM | POA: Diagnosis not present

## 2024-02-04 DIAGNOSIS — Z87891 Personal history of nicotine dependence: Secondary | ICD-10-CM | POA: Diagnosis not present

## 2024-02-04 DIAGNOSIS — M810 Age-related osteoporosis without current pathological fracture: Secondary | ICD-10-CM | POA: Diagnosis not present

## 2024-02-04 DIAGNOSIS — E785 Hyperlipidemia, unspecified: Secondary | ICD-10-CM | POA: Diagnosis not present

## 2024-02-04 DIAGNOSIS — E039 Hypothyroidism, unspecified: Secondary | ICD-10-CM | POA: Diagnosis not present

## 2024-02-04 DIAGNOSIS — G4710 Hypersomnia, unspecified: Secondary | ICD-10-CM | POA: Diagnosis not present

## 2024-02-04 DIAGNOSIS — E46 Unspecified protein-calorie malnutrition: Secondary | ICD-10-CM | POA: Diagnosis not present

## 2024-02-04 DIAGNOSIS — Z818 Family history of other mental and behavioral disorders: Secondary | ICD-10-CM | POA: Diagnosis not present

## 2024-02-04 DIAGNOSIS — E1142 Type 2 diabetes mellitus with diabetic polyneuropathy: Secondary | ICD-10-CM | POA: Diagnosis not present

## 2024-02-14 ENCOUNTER — Other Ambulatory Visit: Payer: Self-pay | Admitting: Pulmonary Disease

## 2024-02-15 ENCOUNTER — Other Ambulatory Visit (HOSPITAL_COMMUNITY): Payer: Self-pay

## 2024-02-15 MED ORDER — MODAFINIL 100 MG PO TABS
100.0000 mg | ORAL_TABLET | Freq: Every day | ORAL | 0 refills | Status: DC
  Filled 2024-02-15: qty 30, 30d supply, fill #0

## 2024-02-15 NOTE — Telephone Encounter (Signed)
Can you schedule

## 2024-02-15 NOTE — Telephone Encounter (Signed)
 Refill x 1 sent Needs OV with APP/me

## 2024-02-15 NOTE — Telephone Encounter (Signed)
 Modafinil refill

## 2024-02-16 ENCOUNTER — Other Ambulatory Visit (HOSPITAL_COMMUNITY): Payer: Self-pay

## 2024-02-23 DIAGNOSIS — R634 Abnormal weight loss: Secondary | ICD-10-CM | POA: Diagnosis not present

## 2024-02-23 DIAGNOSIS — R197 Diarrhea, unspecified: Secondary | ICD-10-CM | POA: Diagnosis not present

## 2024-02-23 DIAGNOSIS — Z860100 Personal history of colon polyps, unspecified: Secondary | ICD-10-CM | POA: Diagnosis not present

## 2024-02-24 DIAGNOSIS — R197 Diarrhea, unspecified: Secondary | ICD-10-CM | POA: Diagnosis not present

## 2024-02-29 ENCOUNTER — Ambulatory Visit (HOSPITAL_BASED_OUTPATIENT_CLINIC_OR_DEPARTMENT_OTHER)

## 2024-02-29 ENCOUNTER — Encounter (HOSPITAL_BASED_OUTPATIENT_CLINIC_OR_DEPARTMENT_OTHER): Payer: Self-pay

## 2024-02-29 ENCOUNTER — Other Ambulatory Visit (HOSPITAL_COMMUNITY): Payer: Self-pay

## 2024-02-29 VITALS — BP 126/67 | HR 74 | Ht 63.0 in | Wt 117.0 lb

## 2024-02-29 DIAGNOSIS — G4711 Idiopathic hypersomnia with long sleep time: Secondary | ICD-10-CM | POA: Diagnosis not present

## 2024-02-29 DIAGNOSIS — N132 Hydronephrosis with renal and ureteral calculous obstruction: Secondary | ICD-10-CM | POA: Diagnosis not present

## 2024-02-29 DIAGNOSIS — N202 Calculus of kidney with calculus of ureter: Secondary | ICD-10-CM | POA: Diagnosis not present

## 2024-02-29 DIAGNOSIS — N3020 Other chronic cystitis without hematuria: Secondary | ICD-10-CM | POA: Diagnosis not present

## 2024-02-29 MED ORDER — SULFAMETHOXAZOLE-TRIMETHOPRIM 800-160 MG PO TABS
ORAL_TABLET | ORAL | 0 refills | Status: DC
  Filled 2024-02-29: qty 28, 28d supply, fill #0

## 2024-02-29 NOTE — Progress Notes (Signed)
 @Patient  ID: Charlotte [name], female    DOB: Jul 29, 1950, 73 y.o.   MRN: [id number]  Chief Complaint  Patient presents with   Follow-up    Referring provider: Catherine Charlies LABOR, DO  HPI: Discussed the use of AI scribe software for clinical note transcription with the patient, who gave verbal consent to proceed.  History of Present Illness Charlotte [name] is a 73 year old female who presents for a follow-up regarding her prescription and ongoing health concerns.  She is here to discuss her prescription for modafinil , which she takes at a dose of 50 mg daily. The medication helps her stay awake during the day, although she still experiences some sleepiness. She has been on this medication for a while and has not changed the dose, as she is sensitive to medications.  Since September, she has experienced significant health issues, starting with kidney stones. She had a large struvite kidney stone related to UTIs, which required lithotripsy and a stent placement for five weeks. The stones were broken up in her urethra but not in her kidney, and she still has at least one large stone remaining. She is scheduled for an ultrasound to assess the current status of the stones.  In addition to the kidney stones, she has been experiencing gastrointestinal symptoms, including ongoing diarrhea, stomach cramps, nausea, and weight loss. No fever is present, but there is significant fatigue. Initial tests for C. diff were negative, and she is awaiting a colonoscopy for further evaluation. The diarrhea is usually worse in the morning and sometimes occurs at night.    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 We started her on modafinil  06/2022.  She reports significant improvement she is only using half tablet/50 mg.  She takes  this around 8 AM and this seems to last her through the day. She is able to focus on her work and has increased energy levels. She had a trip to [place]  and she reports that since returning about 6 weeks ago her sleep has been messed up. Her bedtime is now as late as 1 AM and she cannot get up sooner than 9 AM.     Significant tests/ events reviewed   05/2022 NPSG AHI 4.8/h, RDI 10/h, low sat 78%, mostly supine events MSLT 05/2022  3.1 mins, 0/5 SOREMs 10/2013 NPSG-125 pounds-TST 286 minutes, AHI 3.1/hour   07/2021 HST  -pAHI 4.8/hour, lowest desaturation 88 % 01/2022 HST with oral appliance-pAHI 2.6/hour, lowest desaturation 92%  Allergies  Allergen Reactions   Gabapentin  Other (See Comments)    Pt states it makes her dizzy and sleepy   Protonix [Pantoprazole Sodium]     Allergic to all acid reflex meds cause headaches,nausea and diarrhea    Immunization History  Administered Date(s) Administered   INFLUENZA, HIGH DOSE SEASONAL PF 01/17/2022   Influenza, Quadrivalent, Recombinant, Inj, Pf 01/28/2017, 01/19/2018, 01/06/2019, 01/16/2020, 12/17/2020   Influenza, Seasonal, Injecte, Preservative Fre 02/02/2024   PFIZER(Purple Top)SARS-COV-2 Vaccination 05/03/2019, 05/24/2019, 02/04/2020   Pneumococcal Conjugate-13 01/24/2014   Pneumococcal Polysaccharide-23 04/25/2010, 03/09/2020   Tdap 04/29/2011   Zoster, Live 08/19/2012    Past Medical History:  Diagnosis Date   Breast cancer (HCC)    ER+/PR+/Her2-   GERD (gastroesophageal reflux disease)    Hypothyroidism    Idiopathic hypersomnolence    diagnosed  age [age]. had since high school.   PONV (postoperative nausea and vomiting)    Sleep apnea    had test, does not need CPAP   Thyroid  disease    Wears glasses     Tobacco History: Social History   Tobacco Use  Smoking Status Former   Current packs/day: 0.00   Average packs/day: 0.5 packs/day for 5.0 years (2.5 ttl pk-yrs)   Types: Cigarettes   Start date: 04/24/1965    Quit date: 04/24/1970   Years since quitting: 53.8  Smokeless Tobacco Never   Counseling given: Not Answered   Outpatient Medications Prior to Visit  Medication Sig Dispense Refill   Berberine Chloride (BERBERINE HCI PO) Take 400 mg by mouth 2 (two) times daily.     cefdinir  (OMNICEF ) 300 MG capsule Take 1 capsule (300 mg total) by mouth 2 (two) times daily. 15 capsule 0   Cephalexin  250 MG tablet Take 1/2 (125 mg) tablet by mouth nightly before bed.  Begin after completing treatment course of antibiotics. 30 tablet 2   Lifitegrast  (XIIDRA ) 5 % SOLN Place 1 drop into both eyes 2 (two) times daily. 60 each 3   Lifitegrast  (XIIDRA ) 5 % SOLN Place 1 drop into both eyes 2 (two) times daily. 60 each 3   modafinil  (PROVIGIL ) 100 MG tablet Take 1 tablet (100 mg total) by mouth daily. 30 tablet 0   Multiple Vitamins-Minerals (MULTIVITAMIN WITH MINERALS) tablet Take 1 tablet by mouth daily.     OMEGA-3 KRILL OIL PO Take 350 mg by mouth daily.     SYNTHROID  75 MCG tablet Take 1 tablet (75 mcg total) by mouth daily in the morning on an empty stomach 90 tablet 11   Varenicline Tartrate (TYRVAYA) 0.03 MG/ACT SOLN Place 1 spray into the nose 2 (two) times daily.     Lifitegrast  (XIIDRA ) 5 % SOLN Place 1 drop into both eyes 2 (two) times daily. 60 each 3   oxyCODONE -acetaminophen  (PERCOCET) 5-325 MG tablet Take 1-2 tablets by mouth every 6 (six) hours as needed. 20 tablet 0   No facility-administered medications prior to visit.     Review of Systems:   Constitutional:   No  weight loss, night sweats,  Fevers, chills, fatigue, or  lassitude.  HEENT:   No headaches,  Difficulty swallowing,  Tooth/dental problems, or  Sore throat,                No sneezing, itching, ear ache, nasal congestion, post nasal drip,   CV:  No chest pain,  Orthopnea, PND, swelling in lower extremities, anasarca, dizziness, palpitations, syncope.   GI  No heartburn, indigestion, abdominal pain, nausea, vomiting, diarrhea,  change in bowel habits, loss of appetite, bloody stools.   Resp: No shortness of breath with exertion or at rest.  No excess mucus, no productive cough,  No non-productive cough,  No coughing up of blood.  No change in color of mucus.  No wheezing.  No chest wall deformity  Skin: no rash or lesions.  GU: no dysuria, change in color of urine, no urgency or frequency.  No flank pain, no hematuria   MS:  No joint pain or swelling.  No decreased range of motion.  No back pain.    Physical Exam  BP 126/67   Pulse 74   Ht 5' 3 (1.6 m)   Wt 117 lb (53.1 kg)   SpO2 99%   BMI 20.73 kg/m   GEN: A/Ox3; pleasant , NAD, well nourished  HEENT:  [place]/AT,  EACs-clear, TMs-wnl, NOSE-clear, THROAT-clear, no lesions, no postnasal drip or exudate noted.   NECK:  Supple w/ fair ROM; no JVD; normal carotid impulses w/o bruits; no thyromegaly or nodules palpated; no lymphadenopathy.    RESP  Clear  P & A; w/o, wheezes/ rales/ or rhonchi. no accessory muscle use, no dullness to percussion  CARD:  RRR, no m/r/g, no peripheral edema, pulses intact, no cyanosis or clubbing.  GI:   Soft & nt; nml bowel sounds; no organomegaly or masses detected.   Musco: Warm bil, no deformities or joint swelling noted.   Neuro: alert, no focal deficits noted.    Skin: Warm, no lesions or rashes    Lab Results:  CBC    Component Value Date/Time   WBC 8.6 12/15/2023 0401   RBC 4.21 12/15/2023 0401   HGB 12.8 12/15/2023 0401   HGB 13.3 07/09/2020 1041   HGB 14.1 09/30/2016 1326   HCT 37.3 12/15/2023 0401   HCT 43.2 09/30/2016 1326   PLT 216 12/15/2023 0401   PLT 192 07/09/2020 1041   PLT 193 09/30/2016 1326   MCV 88.6 12/15/2023 0401   MCV 87.3 09/30/2016 1326   MCH 30.4 12/15/2023 0401   MCHC 34.3 12/15/2023 0401   RDW 12.5 12/15/2023 0401   RDW 13.2 09/30/2016 1326   LYMPHSABS 1.5 12/15/2023 0401   LYMPHSABS 1.3 09/30/2016 1326   MONOABS 0.7 12/15/2023 0401   MONOABS 0.5 09/30/2016 1326    EOSABS 0.2 12/15/2023 0401   EOSABS 0.1 09/30/2016 1326   BASOSABS 0.1 12/15/2023 0401   BASOSABS 0.1 09/30/2016 1326    BMET    Component Value Date/Time   NA 137 12/15/2023 0401   NA 141 09/30/2016 1326   K 4.0 12/15/2023 0401   K 4.4 09/30/2016 1326   CL 102 12/15/2023 0401   CO2 23 12/15/2023 0401   CO2 27 09/30/2016 1326   GLUCOSE 186 (H) 12/15/2023 0401   GLUCOSE 105 09/30/2016 1326   BUN 20 12/15/2023 0401   BUN 18.1 09/30/2016 1326   CREATININE 0.87 12/15/2023 0401   CREATININE 1.04 (H) 07/09/2020 1041   CREATININE 1.0 09/30/2016 1326   CALCIUM 11.4 (H) 12/15/2023 0401   CALCIUM 10.6 (H) 09/30/2016 1326   GFRNONAA >60 12/15/2023 0401   GFRNONAA 57 (L) 07/09/2020 1041   GFRAA >60 06/27/2019 1335    BNP No results found for: BNP  ProBNP No results found for: PROBNP  Imaging: No results found.  Administration History     None           No data to display          No results found for: NITRICOXIDE   Assessment & Plan:   Assessment & Plan Idiopathic hypersomnolence  Assessment and Plan Assessment & Plan Idiopathic hypersomnia Chronic idiopathic hypersomnia managed with modafinil . Symptoms include daytime sleepiness, with some relief from medication. Previous evaluations ruled out sleep apnea. Current management effective.  Some ongoing fatigue likely related to ongoing GI issues- follows with GI specialist currently. - Continue modafinil  50 mg daily.    Return in about 1 year (around 02/28/2025) for sleep.  Candis Dandy, PA-C 02/29/2024

## 2024-02-29 NOTE — Patient Instructions (Signed)
 Continue Modafinil :  Take 1/2 tab daily.    Follow up in one year; sooner if new or worsening symptoms.

## 2024-03-01 ENCOUNTER — Other Ambulatory Visit: Payer: Self-pay | Admitting: Urology

## 2024-03-07 ENCOUNTER — Other Ambulatory Visit (HOSPITAL_COMMUNITY): Payer: Self-pay

## 2024-03-07 MED ORDER — SULFAMETHOXAZOLE-TRIMETHOPRIM 800-160 MG PO TABS
1.0000 | ORAL_TABLET | Freq: Two times a day (BID) | ORAL | 0 refills | Status: DC
  Filled 2024-03-07: qty 14, 7d supply, fill #0

## 2024-03-09 ENCOUNTER — Encounter (HOSPITAL_COMMUNITY): Payer: Self-pay | Admitting: Urology

## 2024-03-09 NOTE — Progress Notes (Signed)
 [facility] PREOP PHONE CALL   ALLERGIES REVIEWED: YES  MEDICATION REVIEW DONE: YES MEDICATIONS THAT PT SHOULD HOLD (LIST): NSAIDs hold 48hr  CAN PT WALK UP STAIRS WITHOUT SHORTNESS OF BREATH: YES HOME O2: NO CPAP: NO- doesn't use cpap but is borderline OSA  IF YES, INFORMED PT TO BRING CPAP WITH TUBING AND MASK:YES/NO   INFORMED DRIVER NEEDED FOR PROCEDURE: YES   PT WAS GIVEN BLUE FOLDER AT UROLOGY APPT: YES PT INFORMED TO BRING BLUE FOLDER WITH ALL CONTENTS: YES  REVIEWED ARRIVAL TIME AND LOCATION: YES  OTHER PERTINENT INFORMATION:

## 2024-03-09 NOTE — Progress Notes (Signed)
 Attempted to obtain medical history for pre op call via telephone, unable to reach at this time. HIPAA compliant voicemail message left requesting return call to pre surgical testing department.

## 2024-03-11 ENCOUNTER — Encounter (HOSPITAL_COMMUNITY): Payer: Self-pay | Admitting: Urology

## 2024-03-11 ENCOUNTER — Ambulatory Visit (HOSPITAL_COMMUNITY): Admission: RE | Admit: 2024-03-11 | Discharge: 2024-03-11 | Disposition: A | Attending: Urology | Admitting: Urology

## 2024-03-11 ENCOUNTER — Other Ambulatory Visit: Payer: Self-pay

## 2024-03-11 ENCOUNTER — Ambulatory Visit (HOSPITAL_COMMUNITY)

## 2024-03-11 ENCOUNTER — Other Ambulatory Visit (HOSPITAL_COMMUNITY): Payer: Self-pay

## 2024-03-11 ENCOUNTER — Encounter (HOSPITAL_COMMUNITY): Admission: RE | Disposition: A | Payer: Self-pay | Source: Home / Self Care | Attending: Urology

## 2024-03-11 DIAGNOSIS — N132 Hydronephrosis with renal and ureteral calculous obstruction: Secondary | ICD-10-CM | POA: Diagnosis not present

## 2024-03-11 DIAGNOSIS — N201 Calculus of ureter: Secondary | ICD-10-CM | POA: Diagnosis not present

## 2024-03-11 HISTORY — PX: EXTRACORPOREAL SHOCK WAVE LITHOTRIPSY: SHX1557

## 2024-03-11 SURGERY — LITHOTRIPSY, ESWL
Anesthesia: LOCAL | Laterality: Right

## 2024-03-11 MED ORDER — DIPHENHYDRAMINE HCL 25 MG PO CAPS
25.0000 mg | ORAL_CAPSULE | ORAL | Status: AC
  Administered 2024-03-11: 25 mg via ORAL
  Filled 2024-03-11: qty 1

## 2024-03-11 MED ORDER — CIPROFLOXACIN HCL 500 MG PO TABS
500.0000 mg | ORAL_TABLET | ORAL | Status: AC
  Administered 2024-03-11: 500 mg via ORAL
  Filled 2024-03-11: qty 1

## 2024-03-11 MED ORDER — DIAZEPAM 5 MG PO TABS
10.0000 mg | ORAL_TABLET | ORAL | Status: AC
  Administered 2024-03-11: 5 mg via ORAL
  Filled 2024-03-11: qty 2

## 2024-03-11 MED ORDER — SODIUM CHLORIDE 0.9 % IV SOLN: INTRAVENOUS | Status: DC

## 2024-03-11 MED ORDER — CEPHALEXIN 250 MG PO TABS
ORAL_TABLET | Freq: Every day | ORAL | 2 refills | Status: DC
  Filled 2024-03-11: qty 30, fill #0

## 2024-03-11 NOTE — Discharge Instructions (Addendum)
 See [facility] discharge instructions in chart. Moderate Conscious Sedation-Care After  This sheet gives you information about how to care for yourself after your procedure. Your health care provider may also give you more specific instructions. If you have problems or questions, contact your health care provider.  After the procedure, it is common to have: Sleepiness for several hours. Impaired judgment for several hours. Difficulty with balance. Vomiting if you eat too soon.  Follow these instructions at home:  Rest. Do not participate in activities where you could fall or become injured. Do not drive or use machinery. Do not drink alcohol. Do not take sleeping pills or medicines that cause drowsiness. Do not make important decisions or sign legal documents. Do not take care of children on your own.  Eating and drinking Follow the diet recommended by your health care provider. Drink enough fluid to keep your urine pale yellow. If you vomit: Drink water, juice, or soup when you can drink without vomiting. Make sure you have little or no nausea before eating solid foods.  General instructions Take over-the-counter and prescription medicines only as told by your health care provider. Have a responsible adult stay with you for the time you are told. It is important to have someone help care for you until you are awake and alert. Do not smoke. Keep all follow-up visits as told by your health care provider. This is important.  Contact a health care provider if: You are still sleepy or having trouble with balance after 24 hours. You feel light-headed. You keep feeling nauseous or you keep vomiting. You develop a rash. You have a fever. You have redness or swelling around the IV site.  Get help right away if: You have trouble breathing. You have new-onset confusion at home.  This information is not intended to replace advice given to you by your health care provider.  Make sure you discuss any questions you have with your healthcare provider.

## 2024-03-11 NOTE — Op Note (Signed)
 See [employer] OP note scanned into chart. Also because of the size, density, location and other factors that cannot be anticipated I feel this will likely be a staged procedure. This fact supersedes any indication in the scanned [place] [facility] operative note to the contrary.

## 2024-03-11 NOTE — H&P (Signed)
Please see HP scanned into Epic °

## 2024-03-13 ENCOUNTER — Encounter (HOSPITAL_COMMUNITY): Payer: Self-pay | Admitting: Urology

## 2024-03-22 ENCOUNTER — Ambulatory Visit: Admitting: Family Medicine

## 2024-03-22 ENCOUNTER — Encounter: Payer: Self-pay | Admitting: Family Medicine

## 2024-03-22 VITALS — BP 118/72 | HR 75 | Temp 98.3°F | Ht 63.0 in | Wt 114.0 lb

## 2024-03-22 DIAGNOSIS — M810 Age-related osteoporosis without current pathological fracture: Secondary | ICD-10-CM

## 2024-03-22 DIAGNOSIS — G4711 Idiopathic hypersomnia with long sleep time: Secondary | ICD-10-CM

## 2024-03-22 DIAGNOSIS — Z7689 Persons encountering health services in other specified circumstances: Secondary | ICD-10-CM | POA: Diagnosis not present

## 2024-03-22 DIAGNOSIS — N200 Calculus of kidney: Secondary | ICD-10-CM

## 2024-03-22 DIAGNOSIS — E890 Postprocedural hypothyroidism: Secondary | ICD-10-CM

## 2024-03-22 DIAGNOSIS — E119 Type 2 diabetes mellitus without complications: Secondary | ICD-10-CM | POA: Diagnosis not present

## 2024-03-22 DIAGNOSIS — Z853 Personal history of malignant neoplasm of breast: Secondary | ICD-10-CM

## 2024-03-22 LAB — CBC WITH DIFFERENTIAL/PLATELET
Basophils Absolute: 0 K/uL (ref 0.0–0.1)
Basophils Relative: 0.7 % (ref 0.0–3.0)
Eosinophils Absolute: 0.2 K/uL (ref 0.0–0.7)
Eosinophils Relative: 3.9 % (ref 0.0–5.0)
HCT: 36.8 % (ref 36.0–46.0)
Hemoglobin: 12.2 g/dL (ref 12.0–15.0)
Lymphocytes Relative: 29.3 % (ref 12.0–46.0)
Lymphs Abs: 1.7 K/uL (ref 0.7–4.0)
MCHC: 33.1 g/dL (ref 30.0–36.0)
MCV: 87.7 fl (ref 78.0–100.0)
Monocytes Absolute: 0.5 K/uL (ref 0.1–1.0)
Monocytes Relative: 8 % (ref 3.0–12.0)
Neutro Abs: 3.3 K/uL (ref 1.4–7.7)
Neutrophils Relative %: 58.1 % (ref 43.0–77.0)
Platelets: 297 K/uL (ref 150.0–400.0)
RBC: 4.2 Mil/uL (ref 3.87–5.11)
RDW: 15 % (ref 11.5–15.5)
WBC: 5.7 K/uL (ref 4.0–10.5)

## 2024-03-22 LAB — COMPREHENSIVE METABOLIC PANEL WITH GFR
ALT: 20 U/L (ref 3–35)
AST: 27 U/L (ref 5–37)
Albumin: 4.6 g/dL (ref 3.5–5.2)
Alkaline Phosphatase: 89 U/L (ref 39–117)
BUN: 16 mg/dL (ref 6–23)
CO2: 25 meq/L (ref 19–32)
Calcium: 11.2 mg/dL — ABNORMAL HIGH (ref 8.4–10.5)
Chloride: 104 meq/L (ref 96–112)
Creatinine, Ser: 1.08 mg/dL (ref 0.40–1.20)
GFR: 50.14 mL/min — ABNORMAL LOW (ref >60.00)
Glucose, Bld: 98 mg/dL (ref 70–99)
Potassium: 4.7 meq/L (ref 3.5–5.1)
Sodium: 140 meq/L (ref 135–145)
Total Bilirubin: 0.4 mg/dL (ref 0.2–1.2)
Total Protein: 7.1 g/dL (ref 6.0–8.3)

## 2024-03-22 LAB — T4, FREE: Free T4: 0.91 ng/dL (ref 0.60–1.60)

## 2024-03-22 LAB — TSH: TSH: 7.51 u[IU]/mL — ABNORMAL HIGH (ref 0.35–5.50)

## 2024-03-22 LAB — MICROALBUMIN / CREATININE URINE RATIO
Creatinine,U: 29.1 mg/dL
Microalb Creat Ratio: UNDETERMINED mg/g (ref 0.0–30.0)
Microalb, Ur: <0.7 mg/dL

## 2024-03-22 LAB — PHOSPHORUS: Phosphorus: 3.3 mg/dL (ref 2.3–4.6)

## 2024-03-22 LAB — HEMOGLOBIN A1C: Hgb A1c MFr Bld: 6.1 % (ref 4.6–6.5)

## 2024-03-22 LAB — VITAMIN D 25 HYDROXY (VIT D DEFICIENCY, FRACTURES): VITD: 64.02 ng/mL (ref 30.00–100.00)

## 2024-03-22 NOTE — Patient Instructions (Signed)
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
## 2024-03-23 LAB — PTH, INTACT AND CALCIUM
Calcium: 11.1 mg/dL — ABNORMAL HIGH (ref 8.6–10.4)
PTH: 50 pg/mL (ref 16–77)

## 2024-03-24 ENCOUNTER — Encounter: Payer: Self-pay | Admitting: Family Medicine

## 2024-03-24 ENCOUNTER — Ambulatory Visit: Payer: Self-pay | Admitting: Family Medicine

## 2024-03-24 DIAGNOSIS — M810 Age-related osteoporosis without current pathological fracture: Secondary | ICD-10-CM | POA: Insufficient documentation

## 2024-03-24 DIAGNOSIS — Z860100 Personal history of colon polyps, unspecified: Secondary | ICD-10-CM | POA: Insufficient documentation

## 2024-03-24 DIAGNOSIS — N200 Calculus of kidney: Secondary | ICD-10-CM | POA: Insufficient documentation

## 2024-03-24 DIAGNOSIS — K625 Hemorrhage of anus and rectum: Secondary | ICD-10-CM | POA: Insufficient documentation

## 2024-03-24 DIAGNOSIS — K641 Second degree hemorrhoids: Secondary | ICD-10-CM | POA: Insufficient documentation

## 2024-03-24 NOTE — Telephone Encounter (Signed)
 Please call patient with results and schedule follow-up appointment for her. Her calcium levels have mildly decreased to 11.1, they were 11.4 in September. Kidney function is mildly decreased with a GFR 58, but her creatinine is normal which is reassuring. Urine protein levels are normal. Blood cell counts and electrolytes are normal. Vitamin D  levels are in normal range at 64.  Phosphorus and parathyroid hormone is in normal range.  I do recommend she continue to withhold from taking vitamin D  for now, and until we able to workup her elevated calcium.  Her TSH is mildly elevated, suggesting she may need a higher dose of Synthroid .  She had stated that Dr. Balan was going to continue to provide her Synthroid  prescription.  If she would rather Dr. Tommas control her thyroid  medication, then I would recommend she call her and let her know her thyroid  levels are abnormal. If she changes her mind and would like us  to take over management for her thyroid , I be happy to call in the higher dose for her, then we would follow-up closely.   In order to better address her calcium level workup and her neuropathy symptoms, I recommend we follow-up in 2-4 weeks at her earliest convenience to pursue further evaluation

## 2024-04-15 ENCOUNTER — Other Ambulatory Visit (HOSPITAL_COMMUNITY): Payer: Self-pay | Admitting: Endocrinology

## 2024-04-15 DIAGNOSIS — E210 Primary hyperparathyroidism: Secondary | ICD-10-CM

## 2024-04-18 ENCOUNTER — Other Ambulatory Visit (HOSPITAL_COMMUNITY): Payer: Self-pay

## 2024-04-18 MED ORDER — GOLYTELY 236 G PO SOLR
ORAL | 0 refills | Status: AC
  Filled 2024-04-18: qty 4000, 1d supply, fill #0

## 2024-04-19 ENCOUNTER — Other Ambulatory Visit: Payer: Self-pay | Admitting: Pulmonary Disease

## 2024-04-19 ENCOUNTER — Other Ambulatory Visit (HOSPITAL_BASED_OUTPATIENT_CLINIC_OR_DEPARTMENT_OTHER): Payer: Self-pay

## 2024-04-19 ENCOUNTER — Other Ambulatory Visit (HOSPITAL_COMMUNITY): Payer: Self-pay

## 2024-04-19 MED ORDER — MODAFINIL 100 MG PO TABS
100.0000 mg | ORAL_TABLET | Freq: Every day | ORAL | 0 refills | Status: AC
  Filled 2024-04-19: qty 30, 30d supply, fill #0

## 2024-04-19 NOTE — Telephone Encounter (Signed)
 Modanafil refill

## 2024-04-19 NOTE — Telephone Encounter (Signed)
 approved

## 2024-04-20 ENCOUNTER — Other Ambulatory Visit: Payer: Self-pay

## 2024-04-25 ENCOUNTER — Encounter (HOSPITAL_COMMUNITY)
Admission: RE | Admit: 2024-04-25 | Discharge: 2024-04-25 | Disposition: A | Discharge status: Home / Self Care | Source: Ambulatory Visit | Attending: Endocrinology | Admitting: Endocrinology

## 2024-04-25 ENCOUNTER — Ambulatory Visit (HOSPITAL_COMMUNITY)
Admission: RE | Admit: 2024-04-25 | Discharge: 2024-04-25 | Disposition: A | Discharge status: Home / Self Care | Source: Ambulatory Visit | Attending: Endocrinology | Admitting: Endocrinology

## 2024-04-25 DIAGNOSIS — E210 Primary hyperparathyroidism: Secondary | ICD-10-CM | POA: Insufficient documentation

## 2024-04-25 MED ORDER — TECHNETIUM TC 99M SESTAMIBI - CARDIOLITE
27.5000 | Freq: Once | INTRAVENOUS | Status: AC | PRN
  Administered 2024-04-25: 27.5 via INTRAVENOUS

## 2024-05-13 ENCOUNTER — Encounter: Payer: Self-pay | Admitting: General Surgery
